# Patient Record
Sex: Male | Born: 1955 | Race: White | Hispanic: No | Smoking: Never smoker
Health system: Southern US, Community
[De-identification: ages and names within clinical notes are randomized; demographics above are authoritative.]

## PROBLEM LIST (undated history)

## (undated) ENCOUNTER — Emergency Department: Discharge status: Absent without leave | Payer: Medicare Other

## (undated) DIAGNOSIS — M1990 Unspecified osteoarthritis, unspecified site: Secondary | ICD-10-CM

## (undated) DIAGNOSIS — E119 Type 2 diabetes mellitus without complications: Secondary | ICD-10-CM

## (undated) DIAGNOSIS — G4730 Sleep apnea, unspecified: Secondary | ICD-10-CM

## (undated) DIAGNOSIS — M109 Gout, unspecified: Secondary | ICD-10-CM

## (undated) HISTORY — PX: ROTATOR CUFF REPAIR: SHX139

## (undated) HISTORY — PX: KNEE ARTHROSCOPY: SUR90

---

## 2012-04-23 ENCOUNTER — Encounter (HOSPITAL_COMMUNITY): Payer: Self-pay | Admitting: *Deleted

## 2012-04-23 DIAGNOSIS — E119 Type 2 diabetes mellitus without complications: Secondary | ICD-10-CM | POA: Insufficient documentation

## 2012-04-23 DIAGNOSIS — L02219 Cutaneous abscess of trunk, unspecified: Secondary | ICD-10-CM | POA: Insufficient documentation

## 2012-04-23 DIAGNOSIS — M109 Gout, unspecified: Secondary | ICD-10-CM | POA: Insufficient documentation

## 2012-04-23 DIAGNOSIS — Z7982 Long term (current) use of aspirin: Secondary | ICD-10-CM | POA: Insufficient documentation

## 2012-04-23 NOTE — ED Notes (Signed)
Per EMS - pt w/ left upper chest pain, pt w/ possible insect bite to left upper chest, wound area w/ erythema and hot to touch. Pt reports wound has been draining intermittently. Denies fever, body aches or n/v. EKG NSR and unremarkable on assessment.

## 2012-04-24 ENCOUNTER — Emergency Department (HOSPITAL_COMMUNITY)
Admission: EM | Admit: 2012-04-24 | Discharge: 2012-04-24 | Disposition: A | Discharge status: Home / Self Care | Payer: Self-pay | Attending: Emergency Medicine | Admitting: Emergency Medicine

## 2012-04-24 DIAGNOSIS — N61 Mastitis without abscess: Secondary | ICD-10-CM

## 2012-04-24 HISTORY — DX: Type 2 diabetes mellitus without complications: E11.9

## 2012-04-24 HISTORY — DX: Gout, unspecified: M10.9

## 2012-04-24 MED ORDER — CEPHALEXIN 500 MG PO CAPS: 500.0000 mg | ORAL_CAPSULE | Freq: Four times a day (QID) | ORAL | Status: DC

## 2012-04-24 MED ORDER — DOXYCYCLINE HYCLATE 100 MG PO CAPS: 100.0000 mg | ORAL_CAPSULE | Freq: Two times a day (BID) | ORAL | Status: DC

## 2012-04-24 NOTE — ED Provider Notes (Signed)
History     CSN: [id number]  Arrival date & time 04/23/12  2230   First MD Initiated Contact with Patient 04/24/12 0044      Chief Complaint  Patient presents with  . Insect Bite    (Consider location/radiation/quality/duration/timing/severity/associated sxs/prior treatment) HPI George Bridges is a 57 y.o. male presents with abscess and cellulitis.  HE has had this before requiring IND.  Locations is left upper chest, associated pain is moderate to severe, worse with contact or movement, denies associated fever, chills, abdominal pain, dyspnea, productive cough, other rash, myalgias.  Symptoms have been going on for 3-4 days, it has been draining pus intermittently.    Past Medical History  Diagnosis Date  . Diabetes mellitus without complication   . Gout     No past surgical history on file.  No family history on file.  History  Substance Use Topics  . Smoking status: Not on file  . Smokeless tobacco: Not on file  . Alcohol Use: Not on file      Review of Systems At least 10pt or greater review of systems completed and are negative except where specified in the HPI.  Allergies  Review of patient's allergies indicates no known allergies.  Home Medications   Current Outpatient Rx  Name  Route  Sig  Dispense  Refill  . aspirin EC 325 MG tablet   Oral   Take 325 mg by mouth daily.           BP 122/68  Pulse 86  Temp(Src) 98.9 F (37.2 C) (Oral)  Resp 16  Ht 5\' 7"  (1.702 m)  Wt 221 lb 4.8 oz (100.381 kg)  BMI 34.65 kg/m2  SpO2 98%  Physical Exam  Pulmonary/Chest:      Nursing notes reviewed.  Electronic medical record reviewed. VITAL SIGNS:   Filed Vitals:   04/23/12 2236  BP: 122/68  Pulse: 86  Temp: 98.9 F (37.2 C)  TempSrc: Oral  Resp: 16  Height: 5\' 7"  (1.702 m)  Weight: 221 lb 4.8 oz (100.381 kg)  SpO2: 98%   CONSTITUTIONAL: Awake, oriented x4, appears non-toxic HENT: Atraumatic, normocephalic, oral mucosa pink and moist, airway  patent. Nares patent without drainage. External ears normal. EYES: Conjunctiva clear, EOMI, PERRLA NECK: Trachea midline, non-tender, supple CARDIOVASCULAR: Normal heart rate, Normal rhythm, No murmurs, rubs, gallops PULMONARY/CHEST: Clear to auscultation, no rhonchi, wheezes, or rales. Symmetrical breath sounds. Central 1.5cm fluctuant ara that has been draining with 4-5cm of surrounding cellulitis. ABDOMINAL: Non-distended, soft, obese, non-tender - no rebound or guarding.  BS normal. NEUROLOGIC: Non-focal, moving all four extremities, no gross sensory or motor deficits. EXTREMITIES: No clubbing, cyanosis, or edema SKIN: Warm, Dry, No erythema, No rash.  Cellulitis on chest as desc.  ED Course  INCISION AND DRAINAGE Performed by: Jones Skene Authorized by: Jones Skene Consent: Verbal consent obtained. Body area: trunk Location details: left breast Anesthesia: local infiltration Local anesthetic: lidocaine 2% with epinephrine Anesthetic total: 10 ml Patient sedated: no Scalpel size: 11 Incision type: single straight Complexity: simple Drainage: purulent Drainage amount: moderate Wound treatment: wound left open Patient tolerance: Patient tolerated the procedure well with no immediate complications.   (including critical care time)  Labs Reviewed  GLUCOSE, CAPILLARY - Abnormal; Notable for the following:    Glucose-Capillary 334 (*)    All other components within normal limits   No results found.   1. Cellulitis and abscess of breast       MDM  George Bridges is  a 57 y.o. male presents with abscess and cellulitis to left breast fatty tissue.  Complete IND performed.  PT will be on antibiotics for cellulitis. Non-toxic, afebrile, no suggestion of systemic illness.  No indication for further work up.  I explained the diagnosis and have given explicit precautions to return to the ER including increasing redness, swelling, fever, chills or any other new or worsening  symptoms. The patient understands and accepts the medical plan as it's been dictated and I have answered their questions. Discharge instructions concerning home care and prescriptions have been given.  The patient is STABLE and is discharged to home in good condition.         Jones Skene, MD 04/27/12 1120

## 2012-04-24 NOTE — ED Notes (Signed)
Pt has a history of Diabetes Type II but has not been compliant with his medications for approx. A month and a half

## 2012-04-24 NOTE — ED Notes (Signed)
Ultrasound machine at bedside

## 2012-06-22 ENCOUNTER — Encounter (HOSPITAL_COMMUNITY): Payer: Self-pay

## 2012-06-22 ENCOUNTER — Emergency Department (HOSPITAL_COMMUNITY)
Admission: EM | Admit: 2012-06-22 | Discharge: 2012-06-23 | Disposition: A | Discharge status: Home / Self Care | Payer: Self-pay | Attending: Emergency Medicine | Admitting: Emergency Medicine

## 2012-06-22 DIAGNOSIS — E119 Type 2 diabetes mellitus without complications: Secondary | ICD-10-CM

## 2012-06-22 DIAGNOSIS — Z8669 Personal history of other diseases of the nervous system and sense organs: Secondary | ICD-10-CM | POA: Insufficient documentation

## 2012-06-22 DIAGNOSIS — Z8739 Personal history of other diseases of the musculoskeletal system and connective tissue: Secondary | ICD-10-CM | POA: Insufficient documentation

## 2012-06-22 DIAGNOSIS — R45851 Suicidal ideations: Secondary | ICD-10-CM | POA: Insufficient documentation

## 2012-06-22 DIAGNOSIS — F329 Major depressive disorder, single episode, unspecified: Secondary | ICD-10-CM

## 2012-06-22 DIAGNOSIS — G479 Sleep disorder, unspecified: Secondary | ICD-10-CM | POA: Insufficient documentation

## 2012-06-22 DIAGNOSIS — Z7982 Long term (current) use of aspirin: Secondary | ICD-10-CM | POA: Insufficient documentation

## 2012-06-22 HISTORY — DX: Sleep apnea, unspecified: G47.30

## 2012-06-22 LAB — CBC WITH DIFFERENTIAL/PLATELET
Basophils Absolute: 0.1 10*3/uL (ref 0.0–0.1)
Basophils Relative: 1 % (ref 0–1)
Eosinophils Absolute: 0.3 10*3/uL (ref 0.0–0.7)
HCT: 44.7 % (ref 39.0–52.0)
Lymphs Abs: 1.9 10*3/uL (ref 0.7–4.0)
MCHC: 35.3 g/dL (ref 30.0–36.0)
MCV: 87.6 fL (ref 78.0–100.0)
Monocytes Relative: 7 % (ref 3–12)
Neutro Abs: 2.4 10*3/uL (ref 1.7–7.7)
RDW: 12.3 % (ref 11.5–15.5)

## 2012-06-22 LAB — RAPID URINE DRUG SCREEN, HOSP PERFORMED
Amphetamines: NOT DETECTED
Barbiturates: NOT DETECTED
Benzodiazepines: NOT DETECTED
Cocaine: NOT DETECTED
Tetrahydrocannabinol: NOT DETECTED

## 2012-06-22 LAB — BASIC METABOLIC PANEL
BUN: 7 mg/dL (ref 6–23)
Chloride: 94 mEq/L — ABNORMAL LOW (ref 96–112)
Creatinine, Ser: 0.78 mg/dL (ref 0.50–1.35)
GFR calc Af Amer: >90 mL/min (ref >90)
Glucose, Bld: 499 mg/dL — ABNORMAL HIGH (ref 70–99)

## 2012-06-22 LAB — URINALYSIS, ROUTINE W REFLEX MICROSCOPIC
Bilirubin Urine: NEGATIVE
Glucose, UA: >1000 mg/dL — AB
Hgb urine dipstick: NEGATIVE
Ketones, ur: NEGATIVE mg/dL
Nitrite: NEGATIVE
pH: 6 (ref 5.0–8.0)

## 2012-06-22 LAB — ETHANOL: Alcohol, Ethyl (B): <11 mg/dL (ref 0–11)

## 2012-06-22 MED ORDER — SODIUM CHLORIDE 0.9 % IV SOLN
1000.0000 mL | INTRAVENOUS | Status: DC
  Administered 2012-06-22: 1000 mL via INTRAVENOUS

## 2012-06-22 MED ORDER — INSULIN ASPART 100 UNIT/ML ~~LOC~~ SOLN
10.0000 [IU] | Freq: Once | SUBCUTANEOUS | Status: AC
  Administered 2012-06-22: 10 [IU] via SUBCUTANEOUS
  Filled 2012-06-22: qty 1

## 2012-06-22 MED ORDER — SODIUM CHLORIDE 0.9 % IV SOLN
1000.0000 mL | Freq: Once | INTRAVENOUS | Status: AC
  Administered 2012-06-22: 1000 mL via INTRAVENOUS

## 2012-06-22 NOTE — ED Provider Notes (Signed)
History    This chart was scribed for Roxy Horseman PA-C, a non-physician practitioner working with Celene Kras, MD by Lewanda Rife, ED Scribe. This patient was seen in room WTR3/WLPT3 and the patient's care was started at 1547.     CSN: [id number]  Arrival date & time 06/22/12  1500   First MD Initiated Contact with Patient 06/22/12 1524      Chief Complaint  Patient presents with  . Medical Clearance    (Consider location/radiation/quality/duration/timing/severity/associated sxs/prior treatment) The history is provided by the patient.   HPI Comments: George Bridges is a 57 y.o. male who presents to the Emergency Department complaining of depression onset several weeks. Reports precipitant was his wife leaving him and not being able to find a job. Reports brother and sister have been sources of "psychological trauma". Reports it upsets him "Obama is in the white house". Reports suicidal ideations, but wants "someone else to do it". Brother sent him here with police and IVC papers. Denies HI. Denies visual and auditory hallucinations. Denies any physical pain, chest pain, fevers, and headaches. Denies alcohol, and drug use. Denies any alleviating or aggravating factors.     Past Medical History  Diagnosis Date  . Diabetes mellitus without complication   . Gout   . Sleep apnea     History reviewed. No pertinent past surgical history.  No family history on file.  History  Substance Use Topics  . Smoking status: Never Smoker   . Smokeless tobacco: Not on file  . Alcohol Use: No      Review of Systems A complete 10 system review of systems was obtained and all systems are negative except as noted in the HPI and PMH.    Allergies  Review of patient's allergies indicates no known allergies.  Home Medications   Current Outpatient Rx  Name  Route  Sig  Dispense  Refill  . aspirin EC 325 MG tablet   Oral   Take 325 mg by mouth daily.           BP 139/74   Pulse 87  Temp(Src) 98 F (36.7 C) (Oral)  Resp 12  SpO2 96%  Physical Exam  Nursing note and vitals reviewed. Constitutional: He is oriented to person, place, and time. He appears well-developed and well-nourished. No distress.  HENT:  Head: Normocephalic and atraumatic.  Eyes: EOM are normal.  Neck: Neck supple. No tracheal deviation present.  Cardiovascular: Normal rate, regular rhythm and normal heart sounds.  Exam reveals no gallop and no friction rub.   No murmur heard. Pulmonary/Chest: Effort normal and breath sounds normal. No respiratory distress. He has no wheezes.  Musculoskeletal: Normal range of motion.  Neurological: He is alert and oriented to person, place, and time.  Skin: Skin is warm and dry.  Psychiatric: His speech is normal. He is withdrawn. He exhibits a depressed mood. He expresses suicidal ideation. He expresses no homicidal ideation.    ED Course  Procedures (including critical care time) Medications  0.9 %  sodium chloride infusion (1,000 mLs Intravenous New Bag/Given 06/22/12 1721)    Followed by  0.9 %  sodium chloride infusion (not administered)    Followed by  0.9 %  sodium chloride infusion (not administered)   5:23 PM Discussed pt with Dr. Hyacinth Meeker and treatment plan is to infuse IV fluids. No signs of DKA and anion gap is 11. Pt is uncontrolled diabetic and has not been taking his medications since 2013. Suspect noncompliance for  hyperglycemia.   Labs Reviewed - No data to display No results found.   No diagnosis found.    MDM  Patients with depression and SI. Also with uncontrolled diabetes. Patient brought to the emergency department by family member for evaluation of depression and SI. Patient moved to the acute side, as he needs CPAP, and will also need CBG monitoring. Discussed patient with Dr. Hyacinth Meeker, who recommends 10 units of subcutaneous insulin. Discussed patient with the act team, who will evaluate the patient.    I personally  performed the services described in this documentation, which was scribed in my presence. The recorded information has been reviewed and is accurate.     Roxy Horseman, PA-C 06/22/12 2355

## 2012-06-22 NOTE — ED Notes (Addendum)
Pt stated that all his problems started in 2009 when he lost his job and wife lost her job he stated that he would get only part time work His brother owns a business and likes to  belittles him he denies SI ,but he thinks about killing himself every day.He denies HI only to kill his brother sometime

## 2012-06-22 NOTE — ED Notes (Signed)
[id number]<VW> Expected date:<BR> Expected time:<BR> Means of arrival:<BR> Comments:<BR> Bridges, George iv from [employer] he uses c pap to sleep and they can not keep him there. Not taking his medications diabetic, brother took out papers on him and pt is mad and upset.

## 2012-06-22 NOTE — ED Notes (Signed)
Pt presents with [employer] from home. Pt says he was taking a nap at home and he heard a knock on his door. Pt says it was his brother whom he has not spoken to in weeks. Pt says he has been having some family issues with his ex-wife and other family members. Pt is under IVC papers from his brother.

## 2012-06-22 NOTE — ED Provider Notes (Signed)
Medical screening examination/treatment/procedure(s) were performed by non-physician practitioner and as supervising physician I was immediately available for consultation/collaboration.   Celene Kras, MD 06/22/12 [phone]

## 2012-06-22 NOTE — ED Notes (Signed)
Security called to wand patient 

## 2012-06-23 DIAGNOSIS — F329 Major depressive disorder, single episode, unspecified: Secondary | ICD-10-CM

## 2012-06-23 LAB — GLUCOSE, CAPILLARY
Glucose-Capillary: 233 mg/dL — ABNORMAL HIGH (ref 70–99)
Glucose-Capillary: 219 mg/dL — ABNORMAL HIGH (ref 70–99)

## 2012-06-23 MED ORDER — FLUOXETINE HCL 20 MG PO TABS: 20.0000 mg | ORAL_TABLET | Freq: Every day | ORAL | Status: DC

## 2012-06-23 MED ORDER — METFORMIN HCL 500 MG PO TABS: 500.0000 mg | ORAL_TABLET | Freq: Two times a day (BID) | ORAL | Status: DC

## 2012-06-23 NOTE — Discharge Instructions (Signed)
RESOURCE GUIDE  Chronic Pain Problems: Contact [facility]  [phone] Patients need to be referred by their primary care doctor.  Insufficient Money for Medicine: Contact United Way:  call "211."   No Primary Care Doctor: - Call [facility]  [phone] - can help you locate a primary care doctor that  accepts your insurance, provides certain services, etc. - Physician Referral Service- 1-800-533-3463  Agencies that provide inexpensive medical care: - [facility]  [phone] - [facility]  [phone] - [facility]  [phone] - [facility]  [phone] - Planned Parenthood  [phone] - [facility]  [phone]  Medicaid-accepting [place] Providers: - [facility]- [address]  [phone], Mon-Fri 9am-7pm, Sat 9am-1pm - [facility]- [address], [address]  [phone] - [facility]- [address], [address]  [phone] - [facility]- [address] [place] [address]  [phone] - Veita Bland- [address], [phone]  Only accepts [place] Access Medicaid patients after they have their name  applied to their card  Self Pay (no insurance) in [place]: - Sickle Cell Patients: Dr Eric Dean, [facility]  [address], [phone] - [facility]- [address]  [phone]       -     [facility]- [address]       -     [facility]- see information above (Speak to Pam H if you do not have insurance)       -  [facility] [place]- [address],  [phone]       -  [facility]- [address] [place] [address], [phone]       -  Dr Osei-Bonsu-  [address], [place], [phone]       -  [facility] - [address], [phone]       -  [facility]- [address] [place] [address], [phone],  also [address], [phone]       -    [facility]- [address], [phone], 1st & 3rd Saturday        every month, 10am-1pm  [facility] [address] [place], [place] [address] [phone]  [facility] [address] [place],  [address] [phone]  1) Find a Doctor and Pay Out of Pocket Although you won't have to find out who is covered by your insurance plan, it is a good idea to ask around and get recommendations. You will then need to call the office and see if the doctor you have chosen will accept you as a new patient and what types of options they offer for patients who are self-pay. Some doctors offer discounts or will set up payment plans for their patients who do not have insurance, but you will need to ask so you aren't surprised when you get to your appointment.  2) Contact Your Local Health Department Not all health departments have doctors that can see patients for sick visits, but many do, so it is worth a call to see if yours does. If you don't know where your local health department is, you can check in your phone book. The CDC also has a tool   to help you locate your state's health department, and many state websites also have listings of all of their local health departments.  3) Find a Walk-in Clinic If your illness is not likely to be very severe or complicated, you may want to try a walk in clinic. These are popping up all over the country in pharmacies, drugstores, and shopping centers. They're usually staffed by nurse practitioners or physician assistants that have been trained to treat common illnesses and complaints. They're usually fairly quick and inexpensive. However, if you have serious medical issues or chronic medical problems, these are probably not your best option  STD Testing - [place] [facility] [place], [facility] Clinic, [address], [place],  phone [phone] or 1-877-539-9860.  Monday - Friday, call for an appointment. - [place] [facility] [place], [facility] Clinic, [address], [place], phone [phone] or 1-877-539-9860.  Monday - Friday, call for an appointment.  Abuse/Neglect: - [place] Child Abuse Hotline (336) 641-3795 - [place] Child Abuse Hotline 800-378-5315 (After Hours)  Emergency Shelter:  [facility] (336) 271-5985  Maternity Homes: - [facility] (336) 275-9566 - [facility] (704) 372-4663  MRSA Hotline #:   832-7006  Dental Assistance If unable to pay or uninsured, contact:  [place] [facility] to become qualified for the adult dental clinic.  Patients with Medicaid: [facility] [address], [phone], [phone]  If unable to pay, or uninsured, contact [place] [facility] ([phone] in [place], [phone] in [place]) to become qualified for the adult dental clinic  [facility] [address] [place], [place] [address] (336) [phone] www.drcivils.com  Other Low-Cost Community Dental Services: - [facility]- [address], [place], [place], [address], [phone], Ext. 123, 2nd and 4th Thursday of the month at 6:30am.  10 clients each day by appointment, can sometimes see walk-in patients if someone does not show for an appointment. - [facility]- [address], [place], [place], [address], [phone] - [facility]- [address], [place], [place], [address], [phone] - [place] [facility]- [phone] - [facility]- [phone] - [facility]- [phone]       Behavioral Health Resources in the Community  Intensive Outpatient Programs: [place] [facility]      [address] [place], [place] [phone] Both a day and  evening program       [facility]     [address]        [place], [place] [address] [phone]         ADS: [facility] [address] [place] 336-882-2125  [place] [facility] ACCESS LINE: 1-800-853-5163 or 336-641-4981 [address] [place],  [address] Http://www.[facility].[email or web address]/adult.htm   Substance Abuse Resources: - [facility]  336-882-2125 - [facility] 336-784-9470 - [facility] 336-285-9073 - [facility] 336-845-3988 - Residential & Outpatient Substance Abuse Program  800-659-3381  Psychological Services: -  Health  [phone] - [facility]  [phone] - [place] [facility], [address], , ACCESS LINE: 1-800-853-5163 or 336-641-4981, Http://www.[facility].com/services/adult.htm  Mobile Crisis Teams:                                          [facility]         Mobile Crisis Care Unit 1-877-626-1772             [facility] [address] [place] 336-834-9664                                         Interventionist Sharon DeEsch [address] [place] [phone]  Self-Help/Support Groups: [facility] [place] Variety of support groups [phone] (call for more info)   Narcotics Anonymous (NA) [facility] [address] [place] - 2 meetings at this location  Residential Treatment Programs:  [facility]      [address]        [place]       866-801-8205         [facility] [address] [place], [place]  [address] [phone]  [facility]  [address] [place], [place] [address] 336-845-3988 Admissions: 8am-3pm M-F  [facility]     [address]        [place], [place]  [address]       336-841-1104         [facility] [address] [place], [place] [phone]  [facility] [address] [place], [place] 336-285-9073  [facility] - Intensive Outpatient      [address]     [place], [place]       [phone]         [facility]     [address] [place], [place] 877-615-2722 or 336-784-9470  [facility] ([facility]), Medicaid [address] [place], [place] 336-227-7417  [facility]                                               [address] [place] 800-659-3381  [place] [facility] Resources: [facility]- 1-888-581-9988               General Therapy                                                Julie Brannon, PhD        [address]                                       [place], [place] [address]         [phone]   Insurance  [facility]   [address] [place],  [address] 336-349-4454  [facility] [address] [place],  [address] 336-342-8316 Insurance/Medicaid/sponsorship through [facility]  [facility]                                              [address]                                          [place], [place] [address]    Therapy/tele-psych/case         336-342-8316          [facility] [address]   [place], [place]  [address]  Adolescent/group home/case management 336-349-2233                                           Julia Brannon PhD       General therapy       Insurance   [phone]         Dr. Arfeen, Insurance, M-F 336- [phone]  [facility] [place]  [employer] [place] [facility] [address]                 [address]         [address]  [place]                                               [place]                              [place] Phone:  [phone]                                  Phone:   [phone]                   Phone:  [phone]  [place] [facility], [phone] - [place] Services - [facility]- 1-888-581-9988       -     [facility] in , [address],             336-349-4454, Insurance  [place] Child Abuse Hotline (336) 342-1394 or (336) 342-3537 (After Hours)  

## 2012-06-23 NOTE — Consult Note (Signed)
Reason for Consult: Depression and suicidal ideation Referring Physician: Dr. Damian Leavell [name] is an 57 y.o. male.  HPI: George Bridges is a 57 y.o. White male who presents to the Emergency Department with IVC from his brother for  depression over several weeks and passive suicidal thoughts. He was separated from his his wife since 2009 and divorce finalized April 2014. He has lost his full time job and staying with his father over several years. Reportedly his brother and sister have been sources of "psychological trauma and embarrassing him on face book with their comments". Patient endorsing symptoms depression, unhappy, hopeless, helpless, disturbance of sleep and appetite. He has not able to care for diabetes medication. Patient denied suicidal ideation, intension and plans. Denies HI. Denies visual and auditory hallucinations.   Mental Status Examination: Patient appeared as per his stated age, well-developed, and in well-nourished, overweight  in hospital scrubs and poorly  groomed, and maintaining  fair  eye contact. Patient has depressed mood and his affect was constricted. He has normal speech. His thought process is linear and goal directed. Patient has denied suicidal, homicidal ideations, intentions or plans. Patient has no evidence of auditory or visual hallucinations, delusions, and paranoia. Patient has fair insight judgment and impulse control.  Past Medical History  Diagnosis Date  . Diabetes mellitus without complication   . Gout   . Sleep apnea     History reviewed. No pertinent past surgical history.  No family history on file.  Social History:  reports that he has never smoked. He does not have any smokeless tobacco history on file. He reports that he does not drink alcohol or use illicit drugs.  Allergies: No Known Allergies  Medications: I have reviewed the patient's current medications.  Results for orders placed during the hospital encounter of 06/22/12 (from the  past 48 hour(s))  URINALYSIS, ROUTINE W REFLEX MICROSCOPIC     Status: Abnormal   Collection Time    06/22/12  3:44 PM      Result Value Range   Color, Urine YELLOW  YELLOW   APPearance CLEAR  CLEAR   Specific Gravity, Urine 1.040 (*) 1.005 - 1.030   pH 6.0  5.0 - 8.0   Glucose, UA >1000 (*) NEGATIVE mg/dL   Hgb urine dipstick NEGATIVE  NEGATIVE   Bilirubin Urine NEGATIVE  NEGATIVE   Ketones, ur NEGATIVE  NEGATIVE mg/dL   Protein, ur NEGATIVE  NEGATIVE mg/dL   Urobilinogen, UA 1.0  0.0 - 1.0 mg/dL   Nitrite NEGATIVE  NEGATIVE   Leukocytes, UA NEGATIVE  NEGATIVE  URINE RAPID DRUG SCREEN (HOSP PERFORMED)     Status: None   Collection Time    06/22/12  3:44 PM      Result Value Range   Opiates NONE DETECTED  NONE DETECTED   Cocaine NONE DETECTED  NONE DETECTED   Benzodiazepines NONE DETECTED  NONE DETECTED   Amphetamines NONE DETECTED  NONE DETECTED   Tetrahydrocannabinol NONE DETECTED  NONE DETECTED   Barbiturates NONE DETECTED  NONE DETECTED   Comment:            DRUG SCREEN FOR MEDICAL PURPOSES     ONLY.  IF CONFIRMATION IS NEEDED     FOR ANY PURPOSE, NOTIFY LAB     WITHIN 5 DAYS.                LOWEST DETECTABLE LIMITS     FOR URINE DRUG SCREEN     Drug Class  Cutoff (ng/mL)     Amphetamine      1000     Barbiturate      200     Benzodiazepine   200     Tricyclics       300     Opiates          300     Cocaine          300     THC              50  URINE MICROSCOPIC-ADD ON     Status: Abnormal   Collection Time    06/22/12  3:44 PM      Result Value Range   Bacteria, UA FEW (*) RARE  CBC WITH DIFFERENTIAL     Status: Abnormal   Collection Time    06/22/12  3:54 PM      Result Value Range   WBC 5.1  4.0 - 10.5 K/uL   RBC 5.10  4.22 - 5.81 MIL/uL   Hemoglobin 15.8  13.0 - 17.0 g/dL   HCT 40.9  81.1 - 91.4 %   MCV 87.6  78.0 - 100.0 fL   MCH 31.0  26.0 - 34.0 pg   MCHC 35.3  30.0 - 36.0 g/dL   RDW 78.2  95.6 - 21.3 %   Platelets 84 (*) 150 - 400 K/uL    Comment: SPECIMEN CHECKED FOR CLOTS     REPEATED TO VERIFY   Neutrophils Relative 48  43 - 77 %   Lymphocytes Relative 38  12 - 46 %   Monocytes Relative 7  3 - 12 %   Eosinophils Relative 6 (*) 0 - 5 %   Basophils Relative 1  0 - 1 %   Neutro Abs 2.4  1.7 - 7.7 K/uL   Lymphs Abs 1.9  0.7 - 4.0 K/uL   Monocytes Absolute 0.4  0.1 - 1.0 K/uL   Eosinophils Absolute 0.3  0.0 - 0.7 K/uL   Basophils Absolute 0.1  0.0 - 0.1 K/uL   Smear Review LARGE PLATELETS PRESENT    BASIC METABOLIC PANEL     Status: Abnormal   Collection Time    06/22/12  3:54 PM      Result Value Range   Sodium 131 (*) 135 - 145 mEq/L   Potassium 4.3  3.5 - 5.1 mEq/L   Chloride 94 (*) 96 - 112 mEq/L   CO2 26  19 - 32 mEq/L   Glucose, Bld 499 (*) 70 - 99 mg/dL   BUN 7  6 - 23 mg/dL   Creatinine, Ser 0.86  0.50 - 1.35 mg/dL   Calcium 9.6  8.4 - 57.8 mg/dL   GFR calc non Af Amer >90  >90 mL/min   GFR calc Af Amer >90  >90 mL/min   Comment:            The eGFR has been calculated     using the CKD EPI equation.     This calculation has not been     validated in all clinical     situations.     eGFR's persistently     <90 mL/min signify     possible Chronic Kidney Disease.  ETHANOL     Status: None   Collection Time    06/22/12  3:54 PM      Result Value Range   Alcohol, Ethyl (B) <11  0 - 11 mg/dL   Comment:  LOWEST DETECTABLE LIMIT FOR     SERUM ALCOHOL IS 11 mg/dL     FOR MEDICAL PURPOSES ONLY  GLUCOSE, CAPILLARY     Status: Abnormal   Collection Time    06/22/12  6:54 PM      Result Value Range   Glucose-Capillary 408 (*) 70 - 99 mg/dL  GLUCOSE, CAPILLARY     Status: Abnormal   Collection Time    06/22/12  9:46 PM      Result Value Range   Glucose-Capillary 389 (*) 70 - 99 mg/dL  GLUCOSE, CAPILLARY     Status: Abnormal   Collection Time    06/22/12 11:38 PM      Result Value Range   Glucose-Capillary 291 (*) 70 - 99 mg/dL  GLUCOSE, CAPILLARY     Status: Abnormal   Collection Time     06/23/12  1:58 AM      Result Value Range   Glucose-Capillary 233 (*) 70 - 99 mg/dL  GLUCOSE, CAPILLARY     Status: Abnormal   Collection Time    06/23/12  4:12 AM      Result Value Range   Glucose-Capillary 219 (*) 70 - 99 mg/dL    No results found.  Positive for anxiety, bad mood, behavior problems, depression, separation anxiety and sleep disturbance Blood pressure 107/67, pulse 68, temperature 97.7 F (36.5 C), temperature source Oral, resp. rate 18, SpO2 100.00%.   Assessment/Plan: Depression NOS  Recommendation: Recommended start fluoxetine 20 mg daily and referred to out patient psychiatric services at [facility]. Rescind IVC as he has been contracting for safety. Patient does not meet criteria for in patient psychiatric hospitalization.  Cindy Brindisi,JANARDHAHA R. 06/23/2012, 10:01 AM

## 2012-06-23 NOTE — [facility] Suicide Risk Assessment (Signed)
Suicide Risk Assessment  Discharge Assessment     Demographic Factors:  Male, Adolescent or young adult, Divorced or widowed, Caucasian, Low socioeconomic status and Unemployed  Mental Status Per Nursing Assessment::   On Admission:     Current Mental Status by Physician: NA  Loss Factors: Financial problems/change in socioeconomic status  Historical Factors: NA  Risk Reduction Factors:   Sense of responsibility to family, Religious beliefs about death, Living with another person, especially a relative, Positive social support and Positive therapeutic relationship  Continued Clinical Symptoms:  Depression:   Anhedonia Hopelessness Impulsivity Insomnia Recent sense of peace/wellbeing Severe  Cognitive Features That Contribute To Risk:  Loss of executive function Polarized thinking    Suicide Risk:  Minimal: No identifiable suicidal ideation.  Patients presenting with no risk factors but with morbid ruminations; may be classified as minimal risk based on the severity of the depressive symptoms  Discharge Diagnoses:   AXIS I:  Major Depression, single episode AXIS II:  Deferred AXIS III:   Past Medical History  Diagnosis Date  . Diabetes mellitus without complication   . Gout   . Sleep apnea    AXIS IV:  economic problems, housing problems, occupational problems, other psychosocial or environmental problems, problems related to social environment and problems with access to health care services AXIS V:  41-50 serious symptoms  Plan Of Care/Follow-up recommendations:  Activity:  as tolerated Diet:  regular  Is patient on multiple antipsychotic therapies at discharge:  No   Has Patient had three or more failed trials of antipsychotic monotherapy by history:  No  Recommended Plan for Multiple Antipsychotic Therapies: Not applicable  Savian Mazon,JANARDHAHA R. 06/23/2012, 11:06 AM

## 2012-06-23 NOTE — Progress Notes (Signed)
Late entry for 06/22/12 ED visit: [place] resident. Partnership for [facility], Kenny[facility], saw pt Pt refused services

## 2012-06-23 NOTE — ED Provider Notes (Signed)
Filed Vitals:   06/23/12 0822  BP: 107/67  Pulse: 68  Temp: 97.7 F (36.5 C)  Resp: 18    Glucose is down to 219 here.  Pt is non complaint, will write for metformin.  Dr. Shela Commons with psychiatry has seen pt, pt is chronically SI, has MDD.  He doesn't think pt needs inpatient.  He is ok with IVC being rescinded.  Will refer to local PCP's, and per Dr. Shela Commons, give Rx for prozac and he can follow up with [facility] as outpt.    Impressino: MDD Type 2 DM   Gavin Pound. Ellenora Talton, MD 06/23/12 [phone]

## 2012-06-23 NOTE — ED Notes (Signed)
Patient given a drink and another warm blanket. Nurse aware.

## 2017-08-07 ENCOUNTER — Emergency Department (HOSPITAL_COMMUNITY): Payer: Medicare Other

## 2017-08-07 ENCOUNTER — Inpatient Hospital Stay (HOSPITAL_COMMUNITY)
Admission: EM | Admit: 2017-08-07 | Discharge: 2017-08-13 | DRG: 377 | Disposition: A | Discharge status: Home / Self Care | Payer: Medicare Other | Attending: Internal Medicine | Admitting: Internal Medicine

## 2017-08-07 ENCOUNTER — Other Ambulatory Visit: Payer: Self-pay

## 2017-08-07 ENCOUNTER — Encounter (HOSPITAL_COMMUNITY): Payer: Self-pay

## 2017-08-07 DIAGNOSIS — Z8249 Family history of ischemic heart disease and other diseases of the circulatory system: Secondary | ICD-10-CM

## 2017-08-07 DIAGNOSIS — K269 Duodenal ulcer, unspecified as acute or chronic, without hemorrhage or perforation: Secondary | ICD-10-CM | POA: Diagnosis present

## 2017-08-07 DIAGNOSIS — Z885 Allergy status to narcotic agent status: Secondary | ICD-10-CM | POA: Diagnosis not present

## 2017-08-07 DIAGNOSIS — K7469 Other cirrhosis of liver: Secondary | ICD-10-CM | POA: Diagnosis present

## 2017-08-07 DIAGNOSIS — E669 Obesity, unspecified: Secondary | ICD-10-CM | POA: Diagnosis present

## 2017-08-07 DIAGNOSIS — Z79899 Other long term (current) drug therapy: Secondary | ICD-10-CM

## 2017-08-07 DIAGNOSIS — Z833 Family history of diabetes mellitus: Secondary | ICD-10-CM | POA: Diagnosis not present

## 2017-08-07 DIAGNOSIS — E11649 Type 2 diabetes mellitus with hypoglycemia without coma: Secondary | ICD-10-CM | POA: Diagnosis present

## 2017-08-07 DIAGNOSIS — E118 Type 2 diabetes mellitus with unspecified complications: Secondary | ICD-10-CM | POA: Diagnosis not present

## 2017-08-07 DIAGNOSIS — D62 Acute posthemorrhagic anemia: Secondary | ICD-10-CM | POA: Diagnosis present

## 2017-08-07 DIAGNOSIS — K259 Gastric ulcer, unspecified as acute or chronic, without hemorrhage or perforation: Secondary | ICD-10-CM | POA: Diagnosis present

## 2017-08-07 DIAGNOSIS — K921 Melena: Secondary | ICD-10-CM

## 2017-08-07 DIAGNOSIS — K2971 Gastritis, unspecified, with bleeding: Secondary | ICD-10-CM | POA: Diagnosis present

## 2017-08-07 DIAGNOSIS — R188 Other ascites: Secondary | ICD-10-CM | POA: Diagnosis present

## 2017-08-07 DIAGNOSIS — M109 Gout, unspecified: Secondary | ICD-10-CM | POA: Diagnosis present

## 2017-08-07 DIAGNOSIS — G4730 Sleep apnea, unspecified: Secondary | ICD-10-CM | POA: Diagnosis present

## 2017-08-07 DIAGNOSIS — I10 Essential (primary) hypertension: Secondary | ICD-10-CM | POA: Diagnosis present

## 2017-08-07 DIAGNOSIS — K7460 Unspecified cirrhosis of liver: Secondary | ICD-10-CM | POA: Diagnosis not present

## 2017-08-07 DIAGNOSIS — F329 Major depressive disorder, single episode, unspecified: Secondary | ICD-10-CM | POA: Diagnosis present

## 2017-08-07 DIAGNOSIS — E119 Type 2 diabetes mellitus without complications: Secondary | ICD-10-CM

## 2017-08-07 DIAGNOSIS — I8500 Esophageal varices without bleeding: Secondary | ICD-10-CM | POA: Diagnosis present

## 2017-08-07 DIAGNOSIS — E43 Unspecified severe protein-calorie malnutrition: Secondary | ICD-10-CM | POA: Diagnosis present

## 2017-08-07 DIAGNOSIS — Z6834 Body mass index (BMI) 34.0-34.9, adult: Secondary | ICD-10-CM | POA: Diagnosis not present

## 2017-08-07 DIAGNOSIS — Z7984 Long term (current) use of oral hypoglycemic drugs: Secondary | ICD-10-CM | POA: Diagnosis not present

## 2017-08-07 DIAGNOSIS — Z7982 Long term (current) use of aspirin: Secondary | ICD-10-CM | POA: Diagnosis not present

## 2017-08-07 DIAGNOSIS — D500 Iron deficiency anemia secondary to blood loss (chronic): Secondary | ICD-10-CM

## 2017-08-07 DIAGNOSIS — K7031 Alcoholic cirrhosis of liver with ascites: Secondary | ICD-10-CM

## 2017-08-07 DIAGNOSIS — R531 Weakness: Secondary | ICD-10-CM | POA: Diagnosis present

## 2017-08-07 DIAGNOSIS — K922 Gastrointestinal hemorrhage, unspecified: Secondary | ICD-10-CM

## 2017-08-07 LAB — COMPREHENSIVE METABOLIC PANEL
ALT: 45 U/L (ref 17–63)
ANION GAP: 12 (ref 5–15)
AST: 50 U/L — AB (ref 15–41)
Albumin: 2.8 g/dL — ABNORMAL LOW (ref 3.5–5.0)
Alkaline Phosphatase: 57 U/L (ref 38–126)
BUN: 52 mg/dL — AB (ref 6–20)
CHLORIDE: 96 mmol/L — AB (ref 101–111)
CO2: 23 mmol/L (ref 22–32)
Calcium: 8.2 mg/dL — ABNORMAL LOW (ref 8.9–10.3)
Creatinine, Ser: 1.2 mg/dL (ref 0.61–1.24)
GFR calc Af Amer: >60 mL/min (ref >60)
Glucose, Bld: 432 mg/dL — ABNORMAL HIGH (ref 65–99)
POTASSIUM: 3.5 mmol/L (ref 3.5–5.1)
Sodium: 131 mmol/L — ABNORMAL LOW (ref 135–145)
Total Bilirubin: 1.4 mg/dL — ABNORMAL HIGH (ref 0.3–1.2)
Total Protein: 6 g/dL — ABNORMAL LOW (ref 6.5–8.1)

## 2017-08-07 LAB — ACETAMINOPHEN LEVEL

## 2017-08-07 LAB — SALICYLATE LEVEL

## 2017-08-07 LAB — ETHANOL

## 2017-08-07 LAB — RAPID URINE DRUG SCREEN, HOSP PERFORMED
AMPHETAMINES: NOT DETECTED
Benzodiazepines: NOT DETECTED
Cocaine: NOT DETECTED
OPIATES: NOT DETECTED
Tetrahydrocannabinol: NOT DETECTED

## 2017-08-07 LAB — CBC WITH DIFFERENTIAL/PLATELET
BASOS ABS: 0 10*3/uL (ref 0.0–0.1)
BASOS PCT: 0 %
Eosinophils Absolute: 0.3 10*3/uL (ref 0.0–0.7)
Eosinophils Relative: 3 %
HCT: 25.5 % — ABNORMAL LOW (ref 39.0–52.0)
Hemoglobin: 8.7 g/dL — ABNORMAL LOW (ref 13.0–17.0)
LYMPHS PCT: 32 %
Lymphs Abs: 2.6 10*3/uL (ref 0.7–4.0)
MCH: 32.6 pg (ref 26.0–34.0)
MCHC: 34.1 g/dL (ref 30.0–36.0)
MCV: 95.5 fL (ref 78.0–100.0)
MONO ABS: 0.6 10*3/uL (ref 0.1–1.0)
Monocytes Relative: 7 %
Neutro Abs: 4.6 10*3/uL (ref 1.7–7.7)
Neutrophils Relative %: 58 %
PLATELETS: 97 10*3/uL — AB (ref 150–400)
RBC: 2.67 MIL/uL — ABNORMAL LOW (ref 4.22–5.81)
RDW: 13.4 % (ref 11.5–15.5)
WBC: 8 10*3/uL (ref 4.0–10.5)

## 2017-08-07 LAB — HEMOGLOBIN AND HEMATOCRIT, BLOOD
HEMATOCRIT: 26.1 % — AB (ref 39.0–52.0)
HEMOGLOBIN: 9.1 g/dL — AB (ref 13.0–17.0)

## 2017-08-07 LAB — HEMOGLOBIN A1C
Hgb A1c MFr Bld: 11.4 % — ABNORMAL HIGH (ref 4.8–5.6)
MEAN PLASMA GLUCOSE: 280.48 mg/dL

## 2017-08-07 LAB — POC OCCULT BLOOD, ED: FECAL OCCULT BLD: POSITIVE — AB

## 2017-08-07 LAB — GLUCOSE, CAPILLARY
GLUCOSE-CAPILLARY: 229 mg/dL — AB (ref 65–99)
Glucose-Capillary: 300 mg/dL — ABNORMAL HIGH (ref 65–99)
Glucose-Capillary: 243 mg/dL — ABNORMAL HIGH (ref 65–99)
Glucose-Capillary: 260 mg/dL — ABNORMAL HIGH (ref 65–99)

## 2017-08-07 LAB — ABO/RH: ABO/RH(D): A POS

## 2017-08-07 LAB — PROTIME-INR
INR: 1.41
Prothrombin Time: 17.1 seconds — ABNORMAL HIGH (ref 11.4–15.2)

## 2017-08-07 LAB — CBG MONITORING, ED
GLUCOSE-CAPILLARY: 417 mg/dL — AB (ref 65–99)
GLUCOSE-CAPILLARY: 364 mg/dL — AB (ref 65–99)

## 2017-08-07 LAB — MRSA PCR SCREENING: MRSA by PCR: NEGATIVE

## 2017-08-07 LAB — PREPARE RBC (CROSSMATCH)

## 2017-08-07 MED ORDER — SODIUM CHLORIDE 0.9 % IV SOLN
INTRAVENOUS | Status: DC
  Administered 2017-08-07 – 2017-08-09 (×5): via INTRAVENOUS
  Administered 2017-08-10: 75 mL/h via INTRAVENOUS
  Administered 2017-08-10 (×2): via INTRAVENOUS

## 2017-08-07 MED ORDER — SODIUM CHLORIDE 0.9 % IV SOLN
50.0000 ug/h | INTRAVENOUS | Status: DC
  Administered 2017-08-07 – 2017-08-11 (×10): 50 ug/h via INTRAVENOUS
  Filled 2017-08-07 (×15): qty 1

## 2017-08-07 MED ORDER — IOPAMIDOL (ISOVUE-300) INJECTION 61%
100.0000 mL | Freq: Once | INTRAVENOUS | Status: AC | PRN
  Administered 2017-08-07: 100 mL via INTRAVENOUS

## 2017-08-07 MED ORDER — PANTOPRAZOLE SODIUM 40 MG IV SOLR: 40.0000 mg | Freq: Two times a day (BID) | INTRAVENOUS | Status: DC

## 2017-08-07 MED ORDER — SODIUM CHLORIDE 0.9 % IV SOLN
80.0000 mg | Freq: Once | INTRAVENOUS | Status: AC
  Administered 2017-08-07: 80 mg via INTRAVENOUS
  Filled 2017-08-07: qty 80

## 2017-08-07 MED ORDER — ONDANSETRON HCL 4 MG/2ML IJ SOLN
4.0000 mg | Freq: Four times a day (QID) | INTRAMUSCULAR | Status: DC | PRN
  Administered 2017-08-07: 4 mg via INTRAVENOUS
  Filled 2017-08-07: qty 2

## 2017-08-07 MED ORDER — ONDANSETRON HCL 4 MG PO TABS: 4.0000 mg | ORAL_TABLET | Freq: Four times a day (QID) | ORAL | Status: DC | PRN

## 2017-08-07 MED ORDER — INSULIN ASPART 100 UNIT/ML ~~LOC~~ SOLN
0.0000 [IU] | SUBCUTANEOUS | Status: DC
  Administered 2017-08-07 (×2): 3 [IU] via SUBCUTANEOUS
  Administered 2017-08-07 – 2017-08-08 (×3): 5 [IU] via SUBCUTANEOUS
  Administered 2017-08-08 – 2017-08-09 (×8): 3 [IU] via SUBCUTANEOUS
  Administered 2017-08-09: 2 [IU] via SUBCUTANEOUS

## 2017-08-07 MED ORDER — IOPAMIDOL (ISOVUE-300) INJECTION 61%
INTRAVENOUS | Status: AC
  Filled 2017-08-07: qty 100

## 2017-08-07 MED ORDER — SODIUM CHLORIDE 0.9 % IV BOLUS
1000.0000 mL | Freq: Once | INTRAVENOUS | Status: AC
  Administered 2017-08-07: 1000 mL via INTRAVENOUS

## 2017-08-07 MED ORDER — SODIUM CHLORIDE 0.9 % IV SOLN
8.0000 mg/h | INTRAVENOUS | Status: DC
  Administered 2017-08-07 – 2017-08-08 (×3): 8 mg/h via INTRAVENOUS
  Filled 2017-08-07 (×5): qty 80

## 2017-08-07 NOTE — ED Notes (Signed)
Bed: WA21 Expected date:  Expected time:  Means of arrival:  Comments: [age] M hypoglycemia and emotional distress

## 2017-08-07 NOTE — ED Notes (Signed)
EKG given to EDP,Cardama,MD., for review. 

## 2017-08-07 NOTE — Plan of Care (Signed)
Received signout from Dr. Eudelia Bunchardama. Mr. George Bridges is a 62 year old male with past medical history significant for uncontrolled DM type II; who EMS were initially called on due to emotional distress after patient fiance left him.  Initial glucose noted to be 11 for which patient was given 25 g of D10 and 15 g of Insta-Glucose.  Repeat blood sugar 491.  Patient also noted to complain of dark stools.  On admission blood pressures were noted to be soft 94/51-101/50.   Stool guaiac +(melena).  Hemoglobin 8.7(previously 13.3 in 2018), platelets 97, BUN 52, creatinine 1.2, and glucose 432.  Acetaminophen, salicylate, ethanol level undetectable.  CT scan abdomen pelvis showed signs of hepatic cirrhosis with diffuse ascites.  Patient was typed and screened and given 1 L of normal saline IV fluids.  TRH called to admit.  Suspect upper GI bleed.  Place patient on Protonix drip, ordered patient be prepared 2 units of PRBCs and transfused 1 unit of PRBCs. Will need to consult GI in a.m. question need of octreotide drip as well.  Accepted patient to a stepdown bed.

## 2017-08-07 NOTE — ED Notes (Signed)
ED TO INPATIENT HANDOFF REPORT  Name/Age/Gender George Bridges 62 y.o. male  Code Status Code Status History    Date Active Date Inactive Code Status Order ID Comments User Context   06/22/2012 1745 06/23/2012 1506 Full Code [id number]  [facility]      Home/SNF/Other Home  Chief Complaint ?  Level of Care/Admitting Diagnosis ED Disposition    ED Disposition Condition [facility] Area: [place] [100102]  Level of Care: Stepdown [14]  Admit to SDU based on following criteria: Hemodynamic compromise or significant risk of instability:  Patient requiring short term acute titration and management of vasoactive drips, and invasive monitoring (i.e., CVP and Arterial line).  Diagnosis: GI bleed [732202]  Admitting Physician: ANTONE, SUMMONS [[id number]]  Attending Physician: LENIX, KIDD [[id number]]  Estimated length of stay: past midnight tomorrow  Certification:: I certify this patient will need inpatient services for at least 2 midnights  PT Class (Do Not Modify): Inpatient [101]  PT Acc Code (Do Not Modify): Private [1]       Medical History Past Medical History:  Diagnosis Date  . Diabetes mellitus without complication ([place])   . Gout   . Sleep apnea     Allergies Allergies  Allergen Reactions  . Oxycodone Hives    IV Location/Drains/Wounds Patient Lines/Drains/Airways Status   Active Line/Drains/Airways    Name:   Placement date:   Placement time:   Site:   Days:   Peripheral IV 08/07/17 Left Antecubital   08/07/17    0300    Antecubital   less than 1   Peripheral IV 08/07/17 Left;Posterior Forearm   08/07/17    0736    Forearm   less than 1          Labs/Imaging Results for orders placed or performed during the hospital encounter of 08/07/17 (from the past 48 hour(s))  Urine rapid drug screen (hosp performed)     Status: Abnormal   Collection Time: 08/07/17  3:26 AM  Result Value Ref Range   Opiates NONE  DETECTED NONE DETECTED   Cocaine NONE DETECTED NONE DETECTED   Benzodiazepines NONE DETECTED NONE DETECTED   Amphetamines NONE DETECTED NONE DETECTED   Tetrahydrocannabinol NONE DETECTED NONE DETECTED   Barbiturates (A) NONE DETECTED    Result not available. Reagent lot number recalled by manufacturer.    Comment: Performed at [facility], [place] [address], [place], [place] [address]  POC CBG, ED     Status: Abnormal   Collection Time: 08/07/17  3:33 AM  Result Value Ref Range   Glucose-Capillary 417 (H) 65 - 99 mg/dL  CBC with Differential/Platelet     Status: Abnormal   Collection Time: 08/07/17  4:03 AM  Result Value Ref Range   WBC 8.0 4.0 - 10.5 K/uL   RBC 2.67 (L) 4.22 - 5.81 MIL/uL   Hemoglobin 8.7 (L) 13.0 - 17.0 g/dL   HCT 25.5 (L) 39.0 - 52.0 %   MCV 95.5 78.0 - 100.0 fL   MCH 32.6 26.0 - 34.0 pg   MCHC 34.1 30.0 - 36.0 g/dL   RDW 13.4 11.5 - 15.5 %   Platelets 97 (L) 150 - 400 K/uL    Comment: SPECIMEN CHECKED FOR CLOTS REPEATED TO VERIFY PLATELET COUNT CONFIRMED BY SMEAR    Neutrophils Relative % 58 %   Neutro Abs 4.6 1.7 - 7.7 K/uL   Lymphocytes Relative 32 %   Lymphs Abs 2.6  0.7 - 4.0 K/uL   Monocytes Relative 7 %   Monocytes Absolute 0.6 0.1 - 1.0 K/uL   Eosinophils Relative 3 %   Eosinophils Absolute 0.3 0.0 - 0.7 K/uL   Basophils Relative 0 %   Basophils Absolute 0.0 0.0 - 0.1 K/uL    Comment: Performed at [facility], [place] [address], [place], [place] [address]  Comprehensive metabolic panel     Status: Abnormal   Collection Time: 08/07/17  4:03 AM  Result Value Ref Range   Sodium 131 (L) 135 - 145 mmol/L   Potassium 3.5 3.5 - 5.1 mmol/L   Chloride 96 (L) 101 - 111 mmol/L   CO2 23 22 - 32 mmol/L   Glucose, Bld 432 (H) 65 - 99 mg/dL   BUN 52 (H) 6 - 20 mg/dL   Creatinine, Ser 1.20 0.61 - 1.24 mg/dL   Calcium 8.2 (L) 8.9 - 10.3 mg/dL   Total Protein 6.0 (L) 6.5 - 8.1 g/dL   Albumin 2.8 (L) 3.5 - 5.0 g/dL   AST 50  (H) 15 - 41 U/L   ALT 45 17 - 63 U/L   Alkaline Phosphatase 57 38 - 126 U/L   Total Bilirubin 1.4 (H) 0.3 - 1.2 mg/dL   GFR calc non Af Amer >60 >60 mL/min   GFR calc Af Amer >60 >60 mL/min    Comment: (NOTE) The eGFR has been calculated using the CKD EPI equation. This calculation has not been validated in all clinical situations. eGFR's persistently <60 mL/min signify possible Chronic Kidney Disease.    Anion gap 12 5 - 15    Comment: Performed at [facility], [place] [address], [place], [place] [address]  Acetaminophen level     Status: Abnormal   Collection Time: 08/07/17  4:03 AM  Result Value Ref Range   Acetaminophen (Tylenol), Serum <10 (L) 10 - 30 ug/mL    Comment: (NOTE) Therapeutic concentrations vary significantly. A range of 10-30 ug/mL  may be an effective concentration for many patients. However, some  are best treated at concentrations outside of this range. Acetaminophen concentrations >150 ug/mL at 4 hours after ingestion  and >50 ug/mL at 12 hours after ingestion are often associated with  toxic reactions. Performed at [facility], [place] [address], [place], [place] [address]   Salicylate level     Status: None   Collection Time: 08/07/17  4:03 AM  Result Value Ref Range   Salicylate Lvl <1.5 2.8 - 30.0 mg/dL    Comment: Performed at [facility], [place] [address] [place], [place] [address]  Ethanol     Status: None   Collection Time: 08/07/17  4:03 AM  Result Value Ref Range   Alcohol, Ethyl (B) <10 <10 mg/dL    Comment: (NOTE) Lowest detectable limit for serum alcohol is 10 mg/dL. For medical purposes only. Performed at [facility], [place] [address], [place], [place] [address]   ABO/Rh     Status: None   Collection Time: 08/07/17  4:03 AM  Result Value Ref Range   ABO/RH(D)      A POS Performed at [facility], [place] [address] [place], [place] [address]    Type and screen Cassandra     Status: None (Preliminary result)   Collection Time: 08/07/17  4:05 AM  Result Value Ref Range   ABO/RH(D) A POS    Antibody Screen NEG  Sample Expiration      08/10/2017 Performed at [facility], [place] [place], [place] [address]    Unit Number [id number]    Blood Component Type RED CELLS,LR    Unit division 00    Status of Unit ALLOCATED    Transfusion Status OK TO TRANSFUSE    Crossmatch Result Compatible    Unit Number [id number]    Blood Component Type RED CELLS,LR    Unit division 00    Status of Unit ALLOCATED    Transfusion Status OK TO TRANSFUSE    Crossmatch Result Compatible   POC occult blood, ED Provider will collect     Status: Abnormal   Collection Time: 08/07/17  4:28 AM  Result Value Ref Range   Fecal Occult Bld POSITIVE (A) NEGATIVE  CBG monitoring, ED     Status: Abnormal   Collection Time: 08/07/17  6:20 AM  Result Value Ref Range   Glucose-Capillary 364 (H) 65 - 99 mg/dL  Prepare RBC     Status: None   Collection Time: 08/07/17  6:50 AM  Result Value Ref Range   Order Confirmation      ORDER PROCESSED BY BLOOD BANK Performed at [facility], [place] [address] [place],  [address]    Ct Abdomen Pelvis W Contrast  Result Date: 08/07/2017 CLINICAL DATA:  Emotionally distraught.  Depressed.  Melena. EXAM: CT ABDOMEN AND PELVIS WITH CONTRAST TECHNIQUE: Multidetector CT imaging of the abdomen and pelvis was performed using the standard protocol following bolus administration of intravenous contrast. CONTRAST:  125m ISOVUE-300 IOPAMIDOL (ISOVUE-300) INJECTION 61% COMPARISON:  None. FINDINGS: Lower chest: Lung bases are clear. Hepatobiliary: Probable hepatic cirrhosis with enlarged left lateral segment and caudate lobes. No focal liver lesions. Gallbladder and bile ducts are unremarkable. Pancreas: Unremarkable. No pancreatic ductal dilatation or  surrounding inflammatory changes. Spleen: Normal in size without focal abnormality. Adrenals/Urinary Tract: Adrenal glands are unremarkable. Kidneys are normal, without renal calculi, focal lesion, or hydronephrosis. Bladder is unremarkable. Stomach/Bowel: Stomach, small bowel, and colon are not abnormally distended. No wall thickening or inflammatory changes appreciated. Appendix is not identified. Vascular/Lymphatic: Scattered calcification of the abdominal aorta. No aneurysm. Retroperitoneal lymph nodes are not pathologically enlarged. Reproductive: Prostate gland is enlarged, measuring 5.3 cm diameter. Other: Diffuse free fluid throughout the abdomen and pelvis consistent with moderate ascites. No free air. Abdominal wall musculature appears intact. Musculoskeletal: No acute or significant osseous findings. IMPRESSION: 1. Configuration of the liver is consistent with hepatic cirrhosis. 2. Diffuse abdominal and pelvic ascites. 3. Enlarged prostate gland. 4. Aortic atherosclerosis. Electronically Signed   By: WLucienne CapersM.D.   On: 08/07/2017 05:58    Pending Labs Unresulted Labs (From admission, onward)   Start     Ordered   08/07/17 0325  Occult blood card to lab, stool  Once,   STAT     08/07/17 0325   Signed and Held  HIV antibody (Routine Testing)  Once,   R     Signed and Held   Signed and Held  Hemoglobin A1c  Once,   R    Comments:  To assess prior glycemic control    Signed and Held   Signed and Held  CBC  Tomorrow morning,   R     Signed and Held   Signed and Held  Comprehensive metabolic panel  Tomorrow morning,   R     Signed and Held      Vitals/Pain Today's Vitals  08/07/17 0255 08/07/17 0603 08/07/17 0733 08/07/17 0800  BP:  (!) 94/51 110/64 113/72  Pulse:  (!) 106 96 94  Resp:  '18 20 14  ' Temp:      TempSrc:      SpO2:  98% 97% 98%  Weight: 198 lb (89.8 kg)     Height: '5\' 6"'  (1.676 m)     PainSc:        Isolation Precautions No active  isolations  Medications Medications  pantoprazole (PROTONIX) 80 mg in sodium chloride 0.9 % 250 mL (0.32 mg/mL) infusion (8 mg/hr Intravenous New Bag/Given 08/07/17 0758)  pantoprazole (PROTONIX) injection 40 mg (has no administration in time range)  0.9 %  sodium chloride infusion ( Intravenous New Bag/Given 08/07/17 0800)  iopamidol (ISOVUE-300) 61 % injection 100 mL (100 mLs Intravenous Contrast Given 08/07/17 0540)  sodium chloride 0.9 % bolus 1,000 mL (0 mLs Intravenous Stopped 08/07/17 0757)  pantoprazole (PROTONIX) 80 mg in sodium chloride 0.9 % 100 mL IVPB (0 mg Intravenous Stopped 08/07/17 0757)    Mobility walks with person assist

## 2017-08-07 NOTE — ED Triage Notes (Signed)
Patient called EMS due to being emotionally distraught. Patient states fiance left him a few days ago and he has been depressed ever since. EMS states fire got a CBG of 11 upon arrival, so 25 grams of D10 and 15 grams of instaglucose were given. Patient's last CBG with EMS was 491.  Vitals BP 98/50 P 106 RR 16 SpO2 100 RA

## 2017-08-07 NOTE — ED Provider Notes (Signed)
[facility]-EMERGENCY DEPT Provider Note  CSN: [id number] Arrival date & time: 08/07/17 0231  Chief Complaint(s) Dizziness; Nausea; and Depression  Triage Note 0245 Patient called EMS due to being emotionally distraught. Patient states fiance left him a few days ago and he has been depressed ever since. EMS states fire got a CBG of 11 upon arrival, so 25 grams of D10 and 15 grams of instaglucose were given. Patient's last CBG with EMS was 491.   HPI George Bridges is a 62 y.o. male   The history is provided by the patient.  Dizziness  Quality:  Lightheadedness Severity:  Moderate Onset quality:  Gradual Duration:  2 days Timing:  Constant Progression:  Waxing and waning Context comment:  Decreased oral intake due to nausea. Relieved by:  Change in position Worsened by:  Lying down Associated symptoms: blood in stool (melena started several days ago) and nausea   Associated symptoms: no chest pain, no diarrhea, no palpitations, no shortness of breath, no syncope, no vision changes, no vomiting and no weakness    Denies SI, HI, or AHV.  Patient denies any alcohol use, IV drug use.   Past Medical History Past Medical History:  Diagnosis Date  . Diabetes mellitus without complication (HCC)   . Gout   . Sleep apnea    There are no active problems to display for this patient.  Home Medication(s) Prior to Admission medications   Medication Sig Start Date End Date Taking? Authorizing Provider  aspirin EC 325 MG tablet Take 325 mg by mouth daily.    [provider]  FLUoxetine (PROZAC) 20 MG tablet Take 1 tablet (20 mg total) by mouth daily. 06/23/12   Quita Skye, MD  metFORMIN (GLUCOPHAGE) 500 MG tablet Take 1 tablet (500 mg total) by mouth 2 (two) times daily with a meal. 06/23/12   Quita Skye, MD                                                                                                                                    Past Surgical  History History reviewed. No pertinent surgical history. Family History History reviewed. No pertinent family history.  Social History Social History   Tobacco Use  . Smoking status: Never Smoker  Substance Use Topics  . Alcohol use: No  . Drug use: No   Allergies Oxycodone  Review of Systems Review of Systems  Respiratory: Negative for shortness of breath.   Cardiovascular: Negative for chest pain, palpitations and syncope.  Gastrointestinal: Positive for blood in stool (melena started several days ago) and nausea. Negative for diarrhea and vomiting.  Neurological: Positive for dizziness. Negative for weakness.   All other systems are reviewed and are negative for acute change except as noted in the HPI  Physical Exam Vital Signs  I have reviewed the triage vital signs BP (!) 101/50   Pulse (!) 106   Temp 98.2 F (  36.8 C) (Oral)   Resp 16   Ht 5\' 6"  (1.676 m)   Wt 89.8 kg (198 lb)   SpO2 99%   BMI 31.96 kg/m   Physical Exam  Constitutional: He is oriented to person, place, and time. He appears well-developed and well-nourished. No distress.  HENT:  Head: Normocephalic and atraumatic.  Nose: Nose normal.  Eyes: Pupils are equal, round, and reactive to light. Conjunctivae and EOM are normal. Right eye exhibits no discharge. Left eye exhibits no discharge. No scleral icterus.  Neck: Normal range of motion. Neck supple.  Cardiovascular: Normal rate and regular rhythm. Exam reveals no gallop and no friction rub.  No murmur heard. Pulmonary/Chest: Effort normal and breath sounds normal. No stridor. No respiratory distress. He has no rales.  Abdominal: Soft. He exhibits distension (mild). There is generalized tenderness (mild discomfort). There is no rigidity, no rebound and no guarding.  Genitourinary: Rectal exam shows external hemorrhoid and guaiac positive stool. Prostate is enlarged. Prostate is not tender.  Musculoskeletal: He exhibits no edema or tenderness.    Neurological: He is alert and oriented to person, place, and time.  Skin: Skin is warm and dry. No rash noted. He is not diaphoretic. No erythema.  Psychiatric: He has a normal mood and affect.  Vitals reviewed.   ED Results and Treatments Labs (all labs ordered are listed, but only abnormal results are displayed) Labs Reviewed  CBC WITH DIFFERENTIAL/PLATELET - Abnormal; Notable for the following components:      Result Value   RBC 2.67 (*)    Hemoglobin 8.7 (*)    HCT 25.5 (*)    Platelets 97 (*)    All other components within normal limits  COMPREHENSIVE METABOLIC PANEL - Abnormal; Notable for the following components:   Sodium 131 (*)    Chloride 96 (*)    Glucose, Bld 432 (*)    BUN 52 (*)    Calcium 8.2 (*)    Total Protein 6.0 (*)    Albumin 2.8 (*)    AST 50 (*)    Total Bilirubin 1.4 (*)    All other components within normal limits  ACETAMINOPHEN LEVEL - Abnormal; Notable for the following components:   Acetaminophen (Tylenol), Serum <10 (*)    All other components within normal limits  POC OCCULT BLOOD, ED - Abnormal; Notable for the following components:   Fecal Occult Bld POSITIVE (*)    All other components within normal limits  CBG MONITORING, ED - Abnormal; Notable for the following components:   Glucose-Capillary 417 (*)    All other components within normal limits  CBG MONITORING, ED - Abnormal; Notable for the following components:   Glucose-Capillary 364 (*)    All other components within normal limits  SALICYLATE LEVEL  ETHANOL  OCCULT BLOOD X 1 CARD TO LAB, STOOL  RAPID URINE DRUG SCREEN, HOSP PERFORMED  TYPE AND SCREEN  ABO/RH  EKG  EKG Interpretation  Date/Time:  Sunday August 07 2017 03:56:05 EDT Ventricular Rate:  106 PR Interval:    QRS Duration: 99 QT Interval:  362 QTC Calculation: 481 R Axis:   49 Text  Interpretation:  Sinus tachycardia Borderline prolonged QT interval NO STEMI No old tracing to compare Confirmed by Drema Pry [phone]) on 08/07/2017 4:12:20 AM      Radiology Ct Abdomen Pelvis W Contrast  Result Date: 08/07/2017 CLINICAL DATA:  Emotionally distraught.  Depressed.  Melena. EXAM: CT ABDOMEN AND PELVIS WITH CONTRAST TECHNIQUE: Multidetector CT imaging of the abdomen and pelvis was performed using the standard protocol following bolus administration of intravenous contrast. CONTRAST:  ISOVUE-300 IOPAMIDOL (ISOVUE-300) INJECTION 61% COMPARISON:  None. FINDINGS: Lower chest: Lung bases are clear. Hepatobiliary: Probable hepatic cirrhosis with enlarged left lateral segment and caudate lobes. No focal liver lesions. Gallbladder and bile ducts are unremarkable. Pancreas: Unremarkable. No pancreatic ductal dilatation or surrounding inflammatory changes. Spleen: Normal in size without focal abnormality. Adrenals/Urinary Tract: Adrenal glands are unremarkable. Kidneys are normal, without renal calculi, focal lesion, or hydronephrosis. Bladder is unremarkable. Stomach/Bowel: Stomach, small bowel, and colon are not abnormally distended. No wall thickening or inflammatory changes appreciated. Appendix is not identified. Vascular/Lymphatic: Scattered calcification of the abdominal aorta. No aneurysm. Retroperitoneal lymph nodes are not pathologically enlarged. Reproductive: Prostate gland is enlarged, measuring 5.3 cm diameter. Other: Diffuse free fluid throughout the abdomen and pelvis consistent with moderate ascites. No free air. Abdominal wall musculature appears intact. Musculoskeletal: No acute or significant osseous findings. IMPRESSION: 1. Configuration of the liver is consistent with hepatic cirrhosis. 2. Diffuse abdominal and pelvic ascites. 3. Enlarged prostate gland. 4. Aortic atherosclerosis. Electronically Signed   By: Burman Nieves M.D.   On: 08/07/2017 05:58   Pertinent labs &  imaging results that were available during my care of the patient were reviewed by me and considered in my medical decision making (see chart for details).  Medications Ordered in ED Medications  iopamidol (ISOVUE-300) 61 % injection (has no administration in time range)  sodium chloride 0.9 % bolus 1,000 mL (1,000 mLs Intravenous New Bag/Given 08/07/17 0607)  iopamidol (ISOVUE-300) 61 % injection 100 mL (100 mLs Intravenous Contrast Given 08/07/17 0540)                                                                                                                                    Procedures Procedures  (including critical care time)  Medical Decision Making / ED Course I have reviewed the nursing notes for this encounter and the patient's prior records (if available in EHR or on provided paperwork).    Patient here with several days of generalized fatigue, lightheadedness and melena.  Work-up with positive Hemoccult and hemoglobin at 8.7, which is a seven-point drop from 5 years ago.  Abdomen with mild distention and diffuse discomfort without evidence of peritonitis.  CT scan was obtained revealing  ascites from likely liver disease.  Labs with mild hyponatremia, notable for hyperglycemia without evidence of DKA.  No renal insufficiency.  BUN elevated likely secondary to GI bleed.  Source of bleed not identified at this time.  Patient is hemodynamically stable at this time and does not require emergent transfusion.  Will discuss case with medicine for admission and continued work-up and management.    Final Clinical Impression(s) / ED Diagnoses Final diagnoses:  Melena  Blood loss anemia  Ascites due to alcoholic cirrhosis (HCC)      This chart was dictated using voice recognition software.  Despite best efforts to proofread,  errors can occur which can change the documentation meaning.   Nira Conn, MD 08/07/17 [phone]

## 2017-08-07 NOTE — Plan of Care (Signed)
  Problem: Education: Goal: Knowledge of General Education information will improve Outcome: Progressing   Problem: Health Behavior/Discharge Planning: Goal: Ability to manage health-related needs will improve Outcome: Progressing   Problem: Clinical Measurements: Goal: Ability to maintain clinical measurements within normal limits will improve Outcome: Progressing Goal: Will remain free from infection Outcome: Not Applicable Goal: Diagnostic test results will improve Outcome: Progressing Goal: Respiratory complications will improve Outcome: Not Applicable Goal: Cardiovascular complication will be avoided Outcome: Not Applicable   Problem: Coping: Goal: Level of anxiety will decrease Outcome: Progressing   Problem: Elimination: Goal: Will not experience complications related to bowel motility Outcome: Not Applicable Goal: Will not experience complications related to urinary retention Outcome: Not Applicable   Problem: Pain Managment: Goal: General experience of comfort will improve Outcome: Progressing   Problem: Safety: Goal: Ability to remain free from injury will improve Outcome: Progressing   Problem: Bleeding Precautions Goal: No active bleeding Outcome: Progressing

## 2017-08-07 NOTE — ED Notes (Signed)
Pt. Made aware for the need of urine specimen. 

## 2017-08-07 NOTE — ED Notes (Signed)
Patient too nauseated to stand up for orthostatic vitals.

## 2017-08-07 NOTE — Consult Note (Signed)
[facility] Consult  Referring Provider: Noralee Stain, DO Primary Care Physician:  Maudie Flakes, FNP Primary Gastroenterologist: Gentry Fitz  Reason for Consultation: Melena, anemia  HPI: George Bridges is a 62 y.o. male who was admitted today with c/o black stools and anemia. Patient states he was in his usual state of health until 2 to 3 days prior to presentation,when  he developed weakness along with dizziness.  Normally has a bowel movement every few days, however, for the last several days he reports several episodes of loose and black stools.  He denies vomiting blood or noticing rectal bleeding. Denies use of NSAIDs, takes aspirin 81 mg daily, no prior EGD or colonoscopy. Denies difficulty swallowing, pain on swallowing, unintentional weight loss. CAT scan on admission showed cirrhosis of liver-denies history of alcohol abuse, exposure to hepatitis B/hepatitis C or high risk behavior.   Past Medical History:  Diagnosis Date  . Diabetes mellitus without complication (HCC)   . Gout   . Sleep apnea     Past Surgical History:  Procedure Laterality Date  . KNEE ARTHROSCOPY Left   . ROTATOR CUFF REPAIR Bilateral     Prior to Admission medications   Medication Sig Start Date End Date Taking? Authorizing Provider  aspirin EC 81 MG tablet Take 81 mg by mouth daily.   Yes [provider]  glimepiride (AMARYL) 2 MG tablet Take 1 mg by mouth daily with breakfast.  05/23/17  Yes [provider]  FLUoxetine (PROZAC) 20 MG tablet Take 1 tablet (20 mg total) by mouth daily. Patient not taking: Reported on 08/07/2017 06/23/12   Quita Skye, MD  metFORMIN (GLUCOPHAGE) 500 MG tablet Take 1 tablet (500 mg total) by mouth 2 (two) times daily with a meal. 06/23/12   Quita Skye, MD    Current Facility-Administered Medications  Medication Dose Route Frequency Provider Last Rate Last Dose  . 0.9 %  sodium chloride infusion   Intravenous Continuous Noralee Stain,  DO   Stopped at 08/07/17 0930  . insulin aspart (novoLOG) injection 0-9 Units  0-9 Units Subcutaneous Q4H Noralee Stain, DO   5 Units at 08/07/17 1111  . octreotide (SANDOSTATIN) 500 mcg in sodium chloride 0.9 % 250 mL (2 mcg/mL) infusion  50 mcg/hr Intravenous Continuous Kerin Salen, MD      . ondansetron [facility]) tablet 4 mg  4 mg Oral Q6H PRN Noralee Stain, DO       Or  . ondansetron (ZOFRAN) injection 4 mg  4 mg Intravenous Q6H PRN Noralee Stain, DO      . pantoprazole (PROTONIX) 80 mg in sodium chloride 0.9 % 250 mL (0.32 mg/mL) infusion  8 mg/hr Intravenous Continuous Noralee Stain, DO 25 mL/hr at 08/07/17 0758 8 mg/hr at 08/07/17 0758  . [START ON 08/10/2017] pantoprazole (PROTONIX) injection 40 mg  40 mg Intravenous Q12H Noralee Stain, DO        Allergies as of 08/07/2017 - Review Complete 08/07/2017  Allergen Reaction Noted  . Oxycodone Hives 08/07/2017    Family History  Problem Relation Age of Onset  . Diabetes Father   . CAD Brother     Social History   Socioeconomic History  . Marital status: Single    Spouse name: Not on file  . Number of children: Not on file  . Years of education: Not on file  . Highest education level: Not on file  Occupational History  . Not on file  Social Needs  . Financial resource strain: Not very hard  .  Food insecurity:    Worry: Never true    Inability: Never true  . Transportation needs:    Medical: No    Non-medical: No  Tobacco Use  . Smoking status: Never Smoker  Substance and Sexual Activity  . Alcohol use: No  . Drug use: No  . Sexual activity: Not Currently    Partners: Female  Lifestyle  . Physical activity:    Days per week: Not on file    Minutes per session: Not on file  . Stress: Not on file  Relationships  . Social connections:    Talks on phone: Not on file    Gets together: Not on file    Attends religious service: Not on file    Active member of club or organization: Not on file    Attends meetings  of clubs or organizations: Not on file    Relationship status: Not on file  . Intimate partner violence:    Fear of current or ex partner: No    Emotionally abused: No    Physically abused: No    Forced sexual activity: No  Other Topics Concern  . Not on file  Social History Narrative  . Not on file    Review of Systems: Positive for: GI: Described in detail in HPI.    Gen: anorexia, fatigue, weakness, malaise, involuntary weight loss,Denies any fever, chills, rigors, night sweats,  and sleep disorder CV: Denies chest pain, angina, palpitations, syncope, orthopnea, PND, peripheral edema, and claudication. Resp: Denies dyspnea, cough, sputum, wheezing, coughing up blood. GU : Denies urinary burning, blood in urine, urinary frequency, urinary hesitancy, nocturnal urination, and urinary incontinence. MS: Denies joint pain or swelling.  Denies muscle weakness, cramps, atrophy.  Derm: Denies rash, itching, oral ulcerations, hives, unhealing ulcers.  Psych: Denies depression, anxiety, memory loss, suicidal ideation, hallucinations,  and confusion. Heme: Denies bruising, bleeding, and enlarged lymph nodes. Neuro:  Denies any headaches, dizziness, paresthesias. Endo:  DM,Denies any problems with  thyroid, adrenal function.  Physical Exam: Vital signs in last 24 hours: Temp:  [98 F (36.7 C)-98.6 F (37 C)] 98.4 F (36.9 C) (06/23 0944) Pulse Rate:  [85-106] 85 (06/23 1100) Resp:  [12-20] 16 (06/23 1100) BP: (91-113)/(33-72) 112/53 (06/23 1100) SpO2:  [95 %-99 %] 98 % (06/23 1100) Weight:  [89.8 kg (198 lb)-95.9 kg (211 lb 6.7 oz)] 95.9 kg (211 lb 6.7 oz) (06/23 0844) Last BM Date: 08/07/17(per report)  General:   Alert,  Well-developed, well-nourished, pleasant and cooperative in NAD Head:  Normocephalic and atraumatic. Eyes:  Sclera clear, no icterus.   Mild pallor Ears:  Normal auditory acuity. Nose:  No deformity, discharge,  or lesions. Mouth:  No deformity or lesions.   Oropharynx pink & moist. Neck:  Supple; no masses or thyromegaly. Lungs:  Clear throughout to auscultation.   No wheezes, crackles, or rhonchi. No acute distress. Heart:  Regular rate and rhythm; no murmurs, clicks, rubs,  or gallops. Extremities:  Without clubbing or edema. Neurologic:  Alert and  oriented x4;  grossly normal neurologically. Skin:  Intact without significant lesions or rashes. Psych:  Alert and cooperative. Normal mood and affect. Abdomen:  Soft, nontender and nondistended. No masses, hepatosplenomegaly or hernias noted. Normal bowel sounds, without guarding, and without rebound.         Lab Results: Recent Labs    08/07/17 0403  WBC 8.0  HGB 8.7*  HCT 25.5*  PLT 97*   BMET Recent Labs    08/07/17  0403  NA 131*  K 3.5  CL 96*  CO2 23  GLUCOSE 432*  BUN 52*  CREATININE 1.20  CALCIUM 8.2*   LFT Recent Labs    08/07/17 0403  PROT 6.0*  ALBUMIN 2.8*  AST 50*  ALT 45  ALKPHOS 57  BILITOT 1.4*   PT/INR No results for input(s): LABPROT, INR in the last 72 hours.  Studies/Results: Ct Abdomen Pelvis W Contrast  Result Date: 08/07/2017 CLINICAL DATA:  Emotionally distraught.  Depressed.  Melena. EXAM: CT ABDOMEN AND PELVIS WITH CONTRAST TECHNIQUE: Multidetector CT imaging of the abdomen and pelvis was performed using the standard protocol following bolus administration of intravenous contrast. CONTRAST:  100mL ISOVUE-300 IOPAMIDOL (ISOVUE-300) INJECTION 61% COMPARISON:  None. FINDINGS: Lower chest: Lung bases are clear. Hepatobiliary: Probable hepatic cirrhosis with enlarged left lateral segment and caudate lobes. No focal liver lesions. Gallbladder and bile ducts are unremarkable. Pancreas: Unremarkable. No pancreatic ductal dilatation or surrounding inflammatory changes. Spleen: Normal in size without focal abnormality. Adrenals/Urinary Tract: Adrenal glands are unremarkable. Kidneys are normal, without renal calculi, focal lesion, or hydronephrosis.  Bladder is unremarkable. Stomach/Bowel: Stomach, small bowel, and colon are not abnormally distended. No wall thickening or inflammatory changes appreciated. Appendix is not identified. Vascular/Lymphatic: Scattered calcification of the abdominal aorta. No aneurysm. Retroperitoneal lymph nodes are not pathologically enlarged. Reproductive: Prostate gland is enlarged, measuring 5.3 cm diameter. Other: Diffuse free fluid throughout the abdomen and pelvis consistent with moderate ascites. No free air. Abdominal wall musculature appears intact. Musculoskeletal: No acute or significant osseous findings. IMPRESSION: 1. Configuration of the liver is consistent with hepatic cirrhosis. 2. Diffuse abdominal and pelvic ascites. 3. Enlarged prostate gland. 4. Aortic atherosclerosis. Electronically Signed   By: Burman NievesWilliam  Stevens M.D.   On: 08/07/2017 05:58    Impression: 1. Melena, drop in Hb(13.3 in 1/18 at [facility]) 8.7 on presentation Suspicious for UGI bleeding , elevated BUN/Cr ratio of 52/1.2, FOBT positive stools, ?PUD vs varices vs portal hypertensive gastropathy 2. Newly diagnosed cirrhosis ?NASH Plt 97 Will send for INR to calculate MELDNa score. Will send labs for HBsAG,HCV AB, AMA, ASMA, Alpha 1 antitrypsin, ceruloplasmin level. Iron profile will not be sent, he is receiving blood transfusion  Plan: ON IV Protonix, will start IV octreotide drip Clear liquid diet, NPO post midnight for EGD in am The risk and benefits of the procedure were discussed with the patient in detail. He understands and verbalizes consent.   LOS: 0 days   Kerin SalenArya Kert Shackett, M.D.Amadeo Garneticcardi, MD  08/07/2017, 11:45 AM  Pager [phone] If no answer or after 5 PM call [phone]

## 2017-08-07 NOTE — ED Notes (Signed)
[facility]

## 2017-08-07 NOTE — H&P (Signed)
History and Physical    Adiel Erney WJX:[id number] DOB: November 16, 1955 DOA: 08/07/2017  PCP: Maudie Flakes, FNP  Patient coming from: Home  Chief Complaint: Nausea, weakness, lightheadedness   HPI: George Bridges is a 62 y.o. male with medical history significant of DM, depression who presents after calling EMS for emotional distress.  Patient states that his fiance had broken off the engagement.  When EMS arrived at scene, patient's glucose was noted to be 11.  He was given 25 g of D10 and 15 g of Insta-Glucose.  Repeat blood sugar was 491.  Patient denies any thoughts of harming himself or suicidal ideations.  He states that he does take glimepiride daily, but denies that he took more than prescribed. He also states that for the past 3 days, he has noticed constant, loose, black stools.  He admits to nausea, weakness, lightheadedness.  He denies any vomiting or abdominal pain.  He does take a baby aspirin daily.  He admits to intermittent ibuprofen use, last use was about 3 weeks ago, and does not use this on a daily basis.  He denies any alcohol use.  He states that he has never had a colonoscopy or an upper GI scope in the past.  ED Course: Labs revealed hemoglobin 8.7 with positive FOBT.  His previous hemoglobin from [facility] was 13.3 in January 2018.  Patient was started on IV Protonix, 1 unit packed red blood cell ordered in the emergency room.  Review of Systems: As per HPI otherwise 10 point review of systems negative.   Past Medical History:  Diagnosis Date  . Diabetes mellitus without complication (HCC)   . Gout   . Sleep apnea     Past Surgical History:  Procedure Laterality Date  . KNEE ARTHROSCOPY Left   . ROTATOR CUFF REPAIR Bilateral      reports that he has never smoked. He does not have any smokeless tobacco history on file. He reports that he does not drink alcohol or use drugs.  Allergies  Allergen Reactions  . Oxycodone Hives    Family History  Problem  Relation Age of Onset  . Diabetes Father   . CAD Brother     Prior to Admission medications   Medication Sig Start Date End Date Taking? Authorizing Provider  aspirin EC 325 MG tablet Take 325 mg by mouth daily.    [provider]  FLUoxetine (PROZAC) 20 MG tablet Take 1 tablet (20 mg total) by mouth daily. 06/23/12   Quita Skye, MD  metFORMIN (GLUCOPHAGE) 500 MG tablet Take 1 tablet (500 mg total) by mouth 2 (two) times daily with a meal. 06/23/12   Quita Skye, MD    Physical Exam: Vitals:   08/07/17 0251 08/07/17 0255 08/07/17 0603 08/07/17 0733  BP: (!) 101/50  (!) 94/51 110/64  Pulse: (!) 106  (!) 106 96  Resp: 16  18 20   Temp: 98.2 F (36.8 C)     TempSrc: Oral     SpO2: 99%  98% 97%  Weight:  89.8 kg (198 lb)    Height:  5\' 6"  (1.676 m)      Constitutional: NAD, calm, comfortable Eyes: PERRL, lids and conjunctivae normal ENMT: Mucous membranes are moist. Posterior pharynx clear of any exudate or lesions.Normal dentition.  Neck: normal, supple, no masses, no thyromegaly Respiratory: clear to auscultation bilaterally, no wheezing, no crackles. Normal respiratory effort. No accessory muscle use.  Cardiovascular: Regular rate and rhythm, no murmurs / rubs / gallops. No  extremity edema.  Abdomen: no tenderness, no masses palpated. Bowel sounds positive. Soft.  Musculoskeletal: no clubbing / cyanosis. No joint deformity upper and lower extremities. Good ROM, no contractures. Normal muscle tone.  Skin: no rashes, lesions, ulcers. No induration Neurologic: CN 2-12 grossly intact. Strength 5/5 in all 4.  Psychiatric: Normal judgment and insight. Alert and oriented x 3. Normal mood.   Labs on Admission: I have personally reviewed following labs and imaging studies  CBC: Recent Labs  Lab 08/07/17 0403  WBC 8.0  NEUTROABS 4.6  HGB 8.7*  HCT 25.5*  MCV 95.5  PLT 97*   Basic Metabolic Panel: Recent Labs  Lab 08/07/17 0403  NA 131*  K 3.5  CL 96*  CO2 23    GLUCOSE 432*  BUN 52*  CREATININE 1.20  CALCIUM 8.2*   GFR: Estimated Creatinine Clearance: 67.8 mL/min (by C-G formula based on SCr of 1.2 mg/dL). Liver Function Tests: Recent Labs  Lab 08/07/17 0403  AST 50*  ALT 45  ALKPHOS 57  BILITOT 1.4*  PROT 6.0*  ALBUMIN 2.8*   No results for input(s): LIPASE, AMYLASE in the last 168 hours. No results for input(s): AMMONIA in the last 168 hours. Coagulation Profile: No results for input(s): INR, PROTIME in the last 168 hours. Cardiac Enzymes: No results for input(s): CKTOTAL, CKMB, CKMBINDEX, TROPONINI in the last 168 hours. BNP (last 3 results) No results for input(s): PROBNP in the last 8760 hours. HbA1C: No results for input(s): HGBA1C in the last 72 hours. CBG: Recent Labs  Lab 08/07/17 0333 08/07/17 0620  GLUCAP 417* 364*   Lipid Profile: No results for input(s): CHOL, HDL, LDLCALC, TRIG, CHOLHDL, LDLDIRECT in the last 72 hours. Thyroid Function Tests: No results for input(s): TSH, T4TOTAL, FREET4, T3FREE, THYROIDAB in the last 72 hours. Anemia Panel: No results for input(s): VITAMINB12, FOLATE, FERRITIN, TIBC, IRON, RETICCTPCT in the last 72 hours. Urine analysis:    Component Value Date/Time   COLORURINE YELLOW 06/22/2012 1544   APPEARANCEUR CLEAR 06/22/2012 1544   LABSPEC 1.040 (H) 06/22/2012 1544   PHURINE 6.0 06/22/2012 1544   GLUCOSEU >1000 (A) 06/22/2012 1544   HGBUR NEGATIVE 06/22/2012 1544   BILIRUBINUR NEGATIVE 06/22/2012 1544   KETONESUR NEGATIVE 06/22/2012 1544   PROTEINUR NEGATIVE 06/22/2012 1544   UROBILINOGEN 1.0 06/22/2012 1544   NITRITE NEGATIVE 06/22/2012 1544   LEUKOCYTESUR NEGATIVE 06/22/2012 1544   Sepsis Labs: !!!!!!!!!!!!!!!!!!!!!!!!!!!!!!!!!!!!!!!!!!!! @LABRCNTIP (procalcitonin:4,lacticidven:4) )No results found for this or any previous visit (from the past 240 hour(s)).   Radiological Exams on Admission: Ct Abdomen Pelvis W Contrast  Result Date: 08/07/2017 CLINICAL DATA:   Emotionally distraught.  Depressed.  Melena. EXAM: CT ABDOMEN AND PELVIS WITH CONTRAST TECHNIQUE: Multidetector CT imaging of the abdomen and pelvis was performed using the standard protocol following bolus administration of intravenous contrast. CONTRAST:  100mL ISOVUE-300 IOPAMIDOL (ISOVUE-300) INJECTION 61% COMPARISON:  None. FINDINGS: Lower chest: Lung bases are clear. Hepatobiliary: Probable hepatic cirrhosis with enlarged left lateral segment and caudate lobes. No focal liver lesions. Gallbladder and bile ducts are unremarkable. Pancreas: Unremarkable. No pancreatic ductal dilatation or surrounding inflammatory changes. Spleen: Normal in size without focal abnormality. Adrenals/Urinary Tract: Adrenal glands are unremarkable. Kidneys are normal, without renal calculi, focal lesion, or hydronephrosis. Bladder is unremarkable. Stomach/Bowel: Stomach, small bowel, and colon are not abnormally distended. No wall thickening or inflammatory changes appreciated. Appendix is not identified. Vascular/Lymphatic: Scattered calcification of the abdominal aorta. No aneurysm. Retroperitoneal lymph nodes are not pathologically enlarged. Reproductive: Prostate gland is enlarged,  measuring 5.3 cm diameter. Other: Diffuse free fluid throughout the abdomen and pelvis consistent with moderate ascites. No free air. Abdominal wall musculature appears intact. Musculoskeletal: No acute or significant osseous findings. IMPRESSION: 1. Configuration of the liver is consistent with hepatic cirrhosis. 2. Diffuse abdominal and pelvic ascites. 3. Enlarged prostate gland. 4. Aortic atherosclerosis. Electronically Signed   By: Burman Nieves M.D.   On: 08/07/2017 05:58    EKG: Independently reviewed. Sinus tachycardia without ST changes   Assessment/Plan Principal Problem:   GI bleed Active Problems:   Other cirrhosis of liver (HCC)   DM type 2 (diabetes mellitus, type 2) (HCC)   HTN (hypertension)  GI bleed, melena, with  symptomatic anemia  -Hgb 8.7 from baseline 13.3 in Jan 2018 at [facility]   -FOBT positive  -1u PRBC ordered by ED  -PPI IV  -Hold aspirin  -GI consulted, spoke with Dr. Marca Ancona   Cirrhosis -No hx of alcohol abuse   DM type 2 -Hold home glimepiride and metformin. Patient did present with hypoglycemia which has resolved with tx  -SSI  HTN -Hold cozaar until BP stabilizes in setting of GI bleed    DVT prophylaxis: SCD Code Status: Full  Family Communication: No family at bedside Disposition Plan: Pending GI consultation Consults called: [facility] Dr. Marca Ancona   Admission status: Inpatient   * I certify that at the point of admission it is my clinical judgment that the patient will require inpatient hospital care spanning beyond 2 midnights from the point of admission due to high intensity of service, high risk for further deterioration and high frequency of surveillance required.*   Noralee Stain, DO [facility] [email or web address] Password [facility] 08/07/2017, 8:04 AM

## 2017-08-08 ENCOUNTER — Encounter (HOSPITAL_COMMUNITY)
Admission: EM | Disposition: A | Discharge status: Home / Self Care | Payer: Self-pay | Source: Home / Self Care | Attending: Internal Medicine

## 2017-08-08 ENCOUNTER — Encounter (HOSPITAL_COMMUNITY): Payer: Self-pay

## 2017-08-08 ENCOUNTER — Inpatient Hospital Stay (HOSPITAL_COMMUNITY): Payer: Medicare Other | Admitting: Anesthesiology

## 2017-08-08 DIAGNOSIS — K7031 Alcoholic cirrhosis of liver with ascites: Secondary | ICD-10-CM

## 2017-08-08 HISTORY — PX: ESOPHAGOGASTRODUODENOSCOPY (EGD) WITH PROPOFOL: SHX5813

## 2017-08-08 HISTORY — PX: ESOPHAGEAL BANDING: SHX5518

## 2017-08-08 HISTORY — PX: BIOPSY: SHX5522

## 2017-08-08 LAB — GLUCOSE, CAPILLARY
GLUCOSE-CAPILLARY: 224 mg/dL — AB (ref 65–99)
GLUCOSE-CAPILLARY: 215 mg/dL — AB (ref 65–99)
GLUCOSE-CAPILLARY: 222 mg/dL — AB (ref 65–99)
Glucose-Capillary: 224 mg/dL — ABNORMAL HIGH (ref 65–99)
Glucose-Capillary: 200 mg/dL — ABNORMAL HIGH (ref 65–99)
Glucose-Capillary: 203 mg/dL — ABNORMAL HIGH (ref 65–99)
Glucose-Capillary: 230 mg/dL — ABNORMAL HIGH (ref 65–99)
Glucose-Capillary: 258 mg/dL — ABNORMAL HIGH (ref 65–99)

## 2017-08-08 LAB — CBC
HCT: 25.8 % — ABNORMAL LOW (ref 39.0–52.0)
HEMOGLOBIN: 8.8 g/dL — AB (ref 13.0–17.0)
MCH: 32.4 pg (ref 26.0–34.0)
MCHC: 34.1 g/dL (ref 30.0–36.0)
MCV: 94.9 fL (ref 78.0–100.0)
PLATELETS: 79 10*3/uL — AB (ref 150–400)
RBC: 2.72 MIL/uL — AB (ref 4.22–5.81)
RDW: 15 % (ref 11.5–15.5)
WBC: 5 10*3/uL (ref 4.0–10.5)

## 2017-08-08 LAB — COMPREHENSIVE METABOLIC PANEL
ALBUMIN: 2.6 g/dL — AB (ref 3.5–5.0)
ALK PHOS: 52 U/L (ref 38–126)
ALT: 64 U/L — ABNORMAL HIGH (ref 17–63)
AST: 98 U/L — AB (ref 15–41)
Anion gap: 6 (ref 5–15)
BUN: 40 mg/dL — AB (ref 6–20)
CALCIUM: 7.7 mg/dL — AB (ref 8.9–10.3)
CHLORIDE: 104 mmol/L (ref 101–111)
CO2: 24 mmol/L (ref 22–32)
CREATININE: 1.16 mg/dL (ref 0.61–1.24)
GFR calc Af Amer: >60 mL/min (ref >60)
GFR calc non Af Amer: >60 mL/min (ref >60)
GLUCOSE: 235 mg/dL — AB (ref 65–99)
Potassium: 4.5 mmol/L (ref 3.5–5.1)
Sodium: 134 mmol/L — ABNORMAL LOW (ref 135–145)
Total Bilirubin: 1.7 mg/dL — ABNORMAL HIGH (ref 0.3–1.2)
Total Protein: 5.6 g/dL — ABNORMAL LOW (ref 6.5–8.1)

## 2017-08-08 LAB — HIV ANTIBODY (ROUTINE TESTING W REFLEX): HIV SCREEN 4TH GENERATION: NONREACTIVE

## 2017-08-08 LAB — ALPHA-1-ANTITRYPSIN: A1 ANTITRYPSIN SER: 136 mg/dL (ref 90–200)

## 2017-08-08 LAB — CERULOPLASMIN: CERULOPLASMIN: 19.8 mg/dL (ref 16.0–31.0)

## 2017-08-08 SURGERY — ESOPHAGOGASTRODUODENOSCOPY (EGD) WITH PROPOFOL
Anesthesia: Monitor Anesthesia Care

## 2017-08-08 MED ORDER — PROPOFOL 10 MG/ML IV BOLUS
INTRAVENOUS | Status: AC
  Filled 2017-08-08: qty 60

## 2017-08-08 MED ORDER — PROPOFOL 10 MG/ML IV BOLUS
INTRAVENOUS | Status: DC | PRN
  Administered 2017-08-08 (×3): 20 mg via INTRAVENOUS

## 2017-08-08 MED ORDER — LIDOCAINE 2% (20 MG/ML) 5 ML SYRINGE
INTRAMUSCULAR | Status: DC | PRN
  Administered 2017-08-08: 100 mg via INTRAVENOUS

## 2017-08-08 MED ORDER — SODIUM CHLORIDE 0.9 % IV SOLN: INTRAVENOUS | Status: DC

## 2017-08-08 MED ORDER — LACTATED RINGERS IV SOLN
INTRAVENOUS | Status: DC
  Administered 2017-08-08: 09:00:00 via INTRAVENOUS

## 2017-08-08 MED ORDER — PROPOFOL 500 MG/50ML IV EMUL
INTRAVENOUS | Status: DC | PRN
  Administered 2017-08-08: 120 ug/kg/min via INTRAVENOUS

## 2017-08-08 MED ORDER — PANTOPRAZOLE SODIUM 40 MG IV SOLR
40.0000 mg | Freq: Two times a day (BID) | INTRAVENOUS | Status: DC
  Administered 2017-08-08 – 2017-08-10 (×5): 40 mg via INTRAVENOUS
  Filled 2017-08-08 (×6): qty 40

## 2017-08-08 MED ORDER — PHENYLEPHRINE 40 MCG/ML (10ML) SYRINGE FOR IV PUSH (FOR BLOOD PRESSURE SUPPORT)
PREFILLED_SYRINGE | INTRAVENOUS | Status: DC | PRN
  Administered 2017-08-08 (×2): 80 ug via INTRAVENOUS

## 2017-08-08 SURGICAL SUPPLY — 15 items

## 2017-08-08 NOTE — Anesthesia Postprocedure Evaluation (Signed)
Anesthesia Post Note  Patient: George Bridges  Procedure(s) Performed: ESOPHAGOGASTRODUODENOSCOPY (EGD) WITH PROPOFOL (N/A ) BIOPSY ESOPHAGEAL BANDING     Patient location during evaluation: Endoscopy Anesthesia Type: MAC Level of consciousness: awake and alert Pain management: pain level controlled Vital Signs Assessment: post-procedure vital signs reviewed and stable Respiratory status: spontaneous breathing, nonlabored ventilation and respiratory function stable Cardiovascular status: stable and blood pressure returned to baseline Postop Assessment: no apparent nausea or vomiting Anesthetic complications: no    Last Vitals:  Vitals:   08/08/17 1020 08/08/17 1030  BP: (!) 108/58 (!) 100/57  Pulse: 78 77  Resp: 16 15  Temp:    SpO2: 98% 95%    Last Pain:  Vitals:   08/08/17 1002  TempSrc: Oral  PainSc: 0-No pain                 Lynda Rainwater

## 2017-08-08 NOTE — Progress Notes (Signed)
[facility] Progress Note  Truman HaywardRichard Dauenhauer 62 y.o. 03/18/1955  CC:  Melena, cirrhosis   Subjective: no acute overnight issues. Denied any black tarry stool since admission. Complaining of nausea, denied vomiting.     Objective: Vital signs in last 24 hours: Vitals:   08/08/17 0800 08/08/17 0836  BP: (!) 112/37 106/65  Pulse: 77 76  Resp: 15 19  Temp: 98.5 F (36.9 C) 98.6 F (37 C)  SpO2: 96% 95%    Physical Exam:  Gen. Alert/oriented 3. Not in acute distress Abdomen. Distended, bowel sounds present, no peritoneal signs. Lower extremity. No significant edema Psych : mood and affect normal.  Lab Results: Recent Labs    08/07/17 0403 08/08/17 0337  NA 131* 134*  K 3.5 4.5  CL 96* 104  CO2 23 24  GLUCOSE 432* 235*  BUN 52* 40*  CREATININE 1.20 1.16  CALCIUM 8.2* 7.7*   Recent Labs    08/07/17 0403 08/08/17 0337  AST 50* 98*  ALT 45 64*  ALKPHOS 57 52  BILITOT 1.4* 1.7*  PROT 6.0* 5.6*  ALBUMIN 2.8* 2.6*   Recent Labs    08/07/17 0403 08/07/17 1432 08/08/17 0337  WBC 8.0  --  5.0  NEUTROABS 4.6  --   --   HGB 8.7* 9.1* 8.8*  HCT 25.5* 26.1* 25.8*  MCV 95.5  --  94.9  PLT 97*  --  79*   Recent Labs    08/07/17 1432  LABPROT 17.1*  INR 1.41      Assessment/Plan: - melena in setting of CT scan showing cirrhosis.no significant NSAID use. 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 Hemoglobin stable - newly diagnosed cirrhosis. MELD score 17.  Denies alcohol use although AST > ALT , concerning for underlying alcohol use.   Recommendations ------------------------- - EGD today. - Follow  HBsAG,HCV AB, AMA, ASMA, Alpha 1 antitrypsin, ceruloplasmin level. - paracentesis with fluid analysis after EGD. - consider starting antibiotics after paracentesis. - GI will follow.  Kathi DerParag Albie Arizpe MD, FACP 08/08/2017, 8:51 AM  Contact #  [phone]

## 2017-08-08 NOTE — Brief Op Note (Signed)
08/07/2017 - 08/08/2017  9:58 AM  PATIENT:  George Bridges  62 y.o. male  PRE-OPERATIVE DIAGNOSIS:  melena,anemia  POST-OPERATIVE DIAGNOSIS:  gastritis,esophageal varices banding,gastric and duodenal ulcer  PROCEDURE:  Procedure(s) with comments: ESOPHAGOGASTRODUODENOSCOPY (EGD) WITH PROPOFOL (N/A) - possible banding BIOPSY ESOPHAGEAL BANDING  SURGEON:  Surgeon(s) and Role:    * Kandy Towery, MD - Primary  Findings ----------- - EGD showed large esophageal varices. 3 bands placed. It also showed Diffuse hemorrhagic gastritis,superficial/linear gastric and duodenal bulb ulcers. Biopsies taken for H. Pylori.   Recommendations --------------------------- - Clear liquid diet today - Continue octreotide - Change Protonix to IV twice a day - Repeat CBC in the morning - Ultrasound paracentesis with fluid analysis. - GI will follow  Kathi DerParag Bertel Venard MD, FACP 08/08/2017, 9:59 AM  Contact #  [phone]

## 2017-08-08 NOTE — Progress Notes (Signed)
PROGRESS NOTE    George Bridges  ZOX:[id number] DOB: 12-19-1955 DOA: 08/07/2017 PCP: Maudie Flakes, FNP    Brief Narrative:  62 y.o. male with medical history significant of DM, depression who presents after calling EMS for emotional distress.  Patient states that his fiance had broken off the engagement.  When EMS arrived at scene, patient's glucose was noted to be 11.  He was given 25 g of D10 and 15 g of Insta-Glucose.  Repeat blood sugar was 491.  Patient denies any thoughts of harming himself or suicidal ideations.  He states that he does take glimepiride daily, but denies that he took more than prescribed. He also states that for the past 3 days, he has noticed constant, loose, black stools  Gi on board for Melena, cirrhosis  Assessment & Plan:   Principal Problem:   GI bleed - Management per GI. Pt is s/p EGD Per GI: - Follow  HBsAG,HCV AB, AMA, ASMA, Alpha 1 antitrypsin, ceruloplasmin level. - paracentesis with fluid analysis after EGD. - consider starting antibiotics after paracentesis. - GI will follow.  Findings ----------- - EGD showed large esophageal varices. 3 bands placed. It also showed Diffuse hemorrhagic gastritis,superficial/linear gastric and duodenal bulb ulcers. Biopsies taken for H. Pylori.  Plan per GI:  - Clear liquid diet today - Continue octreotide - Change Protonix to IV twice a day - Repeat CBC in the morning - Ultrasound paracentesis with fluid analysis. - GI will follow  No fever or elevated wbc level will defer antibiotic initiation to GI.  Active Problems:   Other cirrhosis of liver (HCC) - GI following    DM type 2 (diabetes mellitus, type 2) (HCC) - continue SSI - Diet per GI    HTN (hypertension) - stable off antihypertensives   DVT prophylaxis: SCD's Code Status: Full Family Communication: none at bedside. Disposition Plan: continue to monitor closely in stepdown unit.   Consultants:   GI: Dr. Marca Ancona   Procedures:  EGD   Antimicrobials: None   Subjective: The patient has no new complaints. No acute issues overnight reported.  Objective: Vitals:   08/08/17 1002 08/08/17 1010 08/08/17 1020 08/08/17 1030  BP: (!) 93/50 (!) 100/58 (!) 108/58 (!) 100/57  Pulse: 76 79 78 77  Resp: 17 19 16 15   Temp: 98.3 F (36.8 C)     TempSrc: Oral     SpO2: 98% 98% 98% 95%  Weight:      Height:        Intake/Output Summary (Last 24 hours) at 08/08/2017 1222 Last data filed at 08/08/2017 0959 Gross per 24 hour  Intake 4026 ml  Output 925 ml  Net 3101 ml   Filed Weights   08/07/17 0255 08/07/17 0844 08/08/17 0836  Weight: 89.8 kg (198 lb) 95.9 kg (211 lb 6.7 oz) 95.7 kg (211 lb)    Examination:  General exam: Appears calm and comfortable, in nad. Respiratory system: Clear to auscultation. Respiratory effort normal. Equal chest rise. Cardiovascular system: S1 & S2 heard, RRR. No JVD, murmurs, rubs Gastrointestinal system: Abdomen is distended, soft and nontender. Hypoactive bowel sounds. Central nervous system: Alert and oriented. No focal neurological deficits. Extremities: Symmetric 5 x 5 power. Skin: No rashes, lesions or ulcers, on limited exam Psychiatry: Judgement and insight appear normal. Mood & affect appropriate.     Data Reviewed: I have personally reviewed following labs and imaging studies  CBC: Recent Labs  Lab 08/07/17 0403 08/07/17 1432 08/08/17 0337  WBC 8.0  --  5.0  NEUTROABS 4.6  --   --   HGB 8.7* 9.1* 8.8*  HCT 25.5* 26.1* 25.8*  MCV 95.5  --  94.9  PLT 97*  --  79*   Basic Metabolic Panel: Recent Labs  Lab 08/07/17 0403 08/08/17 0337  NA 131* 134*  K 3.5 4.5  CL 96* 104  CO2 23 24  GLUCOSE 432* 235*  BUN 52* 40*  CREATININE 1.20 1.16  CALCIUM 8.2* 7.7*   GFR: Estimated Creatinine Clearance: 72.5 mL/min (by C-G formula based on SCr of 1.16 mg/dL). Liver Function Tests: Recent Labs  Lab 08/07/17 0403 08/08/17 0337  AST 50* 98*  ALT 45 64*    ALKPHOS 57 52  BILITOT 1.4* 1.7*  PROT 6.0* 5.6*  ALBUMIN 2.8* 2.6*   No results for input(s): LIPASE, AMYLASE in the last 168 hours. No results for input(s): AMMONIA in the last 168 hours. Coagulation Profile: Recent Labs  Lab 08/07/17 1432  INR 1.41   Cardiac Enzymes: No results for input(s): CKTOTAL, CKMB, CKMBINDEX, TROPONINI in the last 168 hours. BNP (last 3 results) No results for input(s): PROBNP in the last 8760 hours. HbA1C: Recent Labs    08/07/17 0921  HGBA1C 11.4*   CBG: Recent Labs  Lab 08/07/17 1302 08/07/17 1636 08/07/17 2328 08/08/17 0344 08/08/17 0839  GLUCAP 243* 260* 229* 224* 200*   Lipid Profile: No results for input(s): CHOL, HDL, LDLCALC, TRIG, CHOLHDL, LDLDIRECT in the last 72 hours. Thyroid Function Tests: No results for input(s): TSH, T4TOTAL, FREET4, T3FREE, THYROIDAB in the last 72 hours. Anemia Panel: No results for input(s): VITAMINB12, FOLATE, FERRITIN, TIBC, IRON, RETICCTPCT in the last 72 hours. Sepsis Labs: No results for input(s): PROCALCITON, LATICACIDVEN in the last 168 hours.  Recent Results (from the past 240 hour(s))  MRSA PCR Screening     Status: None   Collection Time: 08/07/17  8:58 AM  Result Value Ref Range Status   MRSA by PCR NEGATIVE NEGATIVE Final    Comment:        The GeneXpert MRSA Assay (FDA approved for NASAL specimens only), is one component of a comprehensive MRSA colonization surveillance program. It is not intended to diagnose MRSA infection nor to guide or monitor treatment for MRSA infections. Performed at [facility], [address] [place], [place] [address]          Radiology Studies: Ct Abdomen Pelvis W Contrast  Result Date: 08/07/2017 CLINICAL DATA:  Emotionally distraught.  Depressed.  Melena. EXAM: CT ABDOMEN AND PELVIS WITH CONTRAST TECHNIQUE: Multidetector CT imaging of the abdomen and pelvis was performed using the standard protocol following bolus  administration of intravenous contrast. CONTRAST:  100mL ISOVUE-300 IOPAMIDOL (ISOVUE-300) INJECTION 61% COMPARISON:  None. FINDINGS: Lower chest: Lung bases are clear. Hepatobiliary: Probable hepatic cirrhosis with enlarged left lateral segment and caudate lobes. No focal liver lesions. Gallbladder and bile ducts are unremarkable. Pancreas: Unremarkable. No pancreatic ductal dilatation or surrounding inflammatory changes. Spleen: Normal in size without focal abnormality. Adrenals/Urinary Tract: Adrenal glands are unremarkable. Kidneys are normal, without renal calculi, focal lesion, or hydronephrosis. Bladder is unremarkable. Stomach/Bowel: Stomach, small bowel, and colon are not abnormally distended. No wall thickening or inflammatory changes appreciated. Appendix is not identified. Vascular/Lymphatic: Scattered calcification of the abdominal aorta. No aneurysm. Retroperitoneal lymph nodes are not pathologically enlarged. Reproductive: Prostate gland is enlarged, measuring 5.3 cm diameter. Other: Diffuse free fluid throughout the abdomen and pelvis consistent with moderate ascites. No free air. Abdominal wall musculature appears  intact. Musculoskeletal: No acute or significant osseous findings. IMPRESSION: 1. Configuration of the liver is consistent with hepatic cirrhosis. 2. Diffuse abdominal and pelvic ascites. 3. Enlarged prostate gland. 4. Aortic atherosclerosis. Electronically Signed   By: Burman Nieves M.D.   On: 08/07/2017 05:58    Scheduled Meds: . insulin aspart  0-9 Units Subcutaneous Q4H  . pantoprazole  40 mg Intravenous Q12H   Continuous Infusions: . sodium chloride 75 mL/hr at 08/08/17 1206  . octreotide  (SANDOSTATIN)    IV infusion 50 mcg/hr (08/08/17 0917)     LOS: 1 day    Time spent: 36 min  Penny Pia, MD [facility] Pager [phone]  If 7PM-7AM, please contact night-coverage [email or web address] Password [facility] 08/08/2017, 12:22 PM

## 2017-08-08 NOTE — Anesthesia Preprocedure Evaluation (Signed)
Anesthesia Evaluation  Patient identified by MRN, date of birth, ID band Patient awake    Reviewed: Allergy & Precautions, NPO status , Patient's Chart, lab work & pertinent test results  Airway Mallampati: II  TM Distance: >3 FB Neck ROM: Full    Dental no notable dental hx.    Pulmonary neg pulmonary ROS, sleep apnea ,    Pulmonary exam normal breath sounds clear to auscultation       Cardiovascular hypertension, Pt. on medications negative cardio ROS Normal cardiovascular exam Rhythm:Regular Rate:Normal     Neuro/Psych negative neurological ROS  negative psych ROS   GI/Hepatic negative GI ROS, Neg liver ROS,   Endo/Other  negative endocrine ROSdiabetes, Type 2, Oral Hypoglycemic Agents  Renal/GU negative Renal ROS  negative genitourinary   Musculoskeletal negative musculoskeletal ROS (+)   Abdominal   Peds negative pediatric ROS (+)  Hematology negative hematology ROS (+) anemia ,   Anesthesia Other Findings   Reproductive/Obstetrics negative OB ROS                             Anesthesia Physical Anesthesia Plan  ASA: III  Anesthesia Plan: MAC   Post-op Pain Management:    Induction: Intravenous  PONV Risk Score and Plan: 1 and Treatment may vary due to age or medical condition  Airway Management Planned: Nasal Cannula  Additional Equipment:   Intra-op Plan:   Post-operative Plan:   Informed Consent: I have reviewed the patients History and Physical, chart, labs and discussed the procedure including the risks, benefits and alternatives for the proposed anesthesia with the patient or authorized representative who has indicated his/her understanding and acceptance.   Dental advisory given  Plan Discussed with: CRNA  Anesthesia Plan Comments:         Anesthesia Quick Evaluation

## 2017-08-08 NOTE — Op Note (Signed)
[facility] Patient Name: George Bridges Procedure Date: 08/08/2017 MRN: [id number] Attending MD: Kathi Der , MD Date of Birth: Dec 15, 1955 CSN: [id number] Age: 62 Admit Type: Inpatient Procedure:                Upper GI endoscopy Indications:              Melena, Cirrhosis with suspected esophageal varices Providers:                Kathi Der, MD, Dwain Sarna, RN, Beryle Beams, Technician, Leroy Libman, CRNA Referring MD:              Medicines:                Sedation Administered by an Anesthesia Professional Complications:            No immediate complications. Estimated Blood Loss:     Estimated blood loss was minimal. Procedure:                Pre-Anesthesia Assessment:                           - Prior to the procedure, a History and Physical                            was performed, and patient medications and                            allergies were reviewed. The patient's tolerance of                            previous anesthesia was also reviewed. The risks                            and benefits of the procedure and the sedation                            options and risks were discussed with the patient.                            All questions were answered, and informed consent                            was obtained. Prior Anticoagulants: The patient has                            taken no previous anticoagulant or antiplatelet                            agents. ASA Grade Assessment: III - A patient with                            severe systemic disease. After reviewing the risks  and benefits, the patient was deemed in                            satisfactory condition to undergo the procedure.                           After obtaining informed consent, the endoscope was                            passed under direct vision. Throughout the                            procedure, the  patient's blood pressure, pulse, and                            oxygen saturations were monitored continuously. The                            [id number] ([id number]) scope was introduced through the                            mouth, and advanced to the second part of duodenum.                            The upper GI endoscopy was accomplished without                            difficulty. The patient tolerated the procedure                            well. Scope In: Scope Out: Findings:      Few non-bleeding linear gastric ulcers were found in the prepyloric       region of the stomach. Biopsies were taken with a cold forceps for       histology.      Segmental severe inflammation with hemorrhage characterized by erosions       and erythema was found in the entire examined stomach.      Red blood was found in the gastric fundus.      Few linear duodenal ulcers were found in the duodenal bulb.      The first portion of the duodenum and second portion of the duodenum       were normal.      Three columns of non-bleeding large (> 5 mm) varices were found in the       distal esophagus,. No red wale signs were present. Three bands were       successfully placed with complete eradication, resulting in deflation of       varices. There was no bleeding during, and at the end, of the procedure.      Esophagogastric landmarks were identified: the gastroesophageal junction       was found at 40 cm from the incisors. Impression:               - Non-bleeding gastric ulcers. Biopsied.                           -  Gastritis with hemorrhage.                           - Red blood in the gastric fundus.                           - Multiple duodenal ulcers.                           - Normal first portion of the duodenum and second                            portion of the duodenum.                           - Non-bleeding large (> 5 mm) esophageal varices.                            Completely eradicated.  Banded. Moderate Sedation:      Moderate (conscious) sedation was personally administered by an       anesthesia professional. The following parameters were monitored: oxygen       saturation, heart rate, blood pressure, and response to care. Recommendation:           - Return patient to hospital ward for ongoing care.                           - Clear liquid diet.                           - Continue present medications.                           - Await pathology results.                           - Repeat upper endoscopy in 4 weeks for retreatment. Procedure Code(s):        --- Professional ---                           [id number], Esophagogastroduodenoscopy, flexible,                            transoral; with band ligation of esophageal/gastric                            varices                           43239, Esophagogastroduodenoscopy, flexible,                            transoral; with biopsy, single or multiple Diagnosis Code(s):        --- Professional ---                           K25.9, Gastric ulcer, unspecified as acute or  chronic, without hemorrhage or perforation                           K29.71, Gastritis, unspecified, with bleeding                           K92.2, Gastrointestinal hemorrhage, unspecified                           K26.9, Duodenal ulcer, unspecified as acute or                            chronic, without hemorrhage or perforation                           K74.60, Unspecified cirrhosis of liver                           I85.10, Secondary esophageal varices without                            bleeding                           K92.1, Melena (includes Hematochezia) CPT copyright 2017 American [employer]. All rights reserved. The codes documented in this report are preliminary and upon coder review may  be revised to meet current compliance requirements. Kathi Der, MD Kathi Der, MD 08/08/2017 9:55:58 AM Number  of Addenda: 0

## 2017-08-08 NOTE — Transfer of Care (Signed)
Immediate Anesthesia Transfer of Care Note  Patient: George Bridges  Procedure(s) Performed: ESOPHAGOGASTRODUODENOSCOPY (EGD) WITH PROPOFOL (N/A ) BIOPSY ESOPHAGEAL BANDING  Patient Location: Endoscopy Unit  Anesthesia Type:MAC  Level of Consciousness: sedated  Airway & Oxygen Therapy: Patient Spontanous Breathing and Patient connected to nasal cannula oxygen  Post-op Assessment: Report given to RN and Post -op Vital signs reviewed and stable  Post vital signs: Reviewed and stable  Last Vitals:  Vitals Value Taken Time  BP    Temp    Pulse    Resp    SpO2      Last Pain:  Vitals:   08/08/17 0836  TempSrc: Oral  PainSc: 0-No pain         Complications: No apparent anesthesia complications

## 2017-08-09 ENCOUNTER — Inpatient Hospital Stay (HOSPITAL_COMMUNITY): Payer: Medicare Other

## 2017-08-09 ENCOUNTER — Encounter (HOSPITAL_COMMUNITY): Payer: Self-pay | Admitting: Gastroenterology

## 2017-08-09 LAB — GLUCOSE, CAPILLARY
GLUCOSE-CAPILLARY: 324 mg/dL — AB (ref 70–99)
GLUCOSE-CAPILLARY: 217 mg/dL — AB (ref 70–99)
Glucose-Capillary: 230 mg/dL — ABNORMAL HIGH (ref 70–99)
Glucose-Capillary: 178 mg/dL — ABNORMAL HIGH (ref 70–99)
Glucose-Capillary: 221 mg/dL — ABNORMAL HIGH (ref 70–99)

## 2017-08-09 LAB — CBC
HCT: 26 % — ABNORMAL LOW (ref 39.0–52.0)
Hemoglobin: 8.7 g/dL — ABNORMAL LOW (ref 13.0–17.0)
MCH: 32.5 pg (ref 26.0–34.0)
MCHC: 33.5 g/dL (ref 30.0–36.0)
MCV: 97 fL (ref 78.0–100.0)
PLATELETS: 83 10*3/uL — AB (ref 150–400)
RBC: 2.68 MIL/uL — ABNORMAL LOW (ref 4.22–5.81)
RDW: 14.9 % (ref 11.5–15.5)
WBC: 4.3 10*3/uL (ref 4.0–10.5)

## 2017-08-09 LAB — COMPREHENSIVE METABOLIC PANEL
ALBUMIN: 2.5 g/dL — AB (ref 3.5–5.0)
ALT: 65 U/L — ABNORMAL HIGH (ref 0–44)
AST: 91 U/L — ABNORMAL HIGH (ref 15–41)
Alkaline Phosphatase: 58 U/L (ref 38–126)
Anion gap: 4 — ABNORMAL LOW (ref 5–15)
BUN: 25 mg/dL — AB (ref 8–23)
CHLORIDE: 108 mmol/L (ref 98–111)
CO2: 22 mmol/L (ref 22–32)
Calcium: 7.4 mg/dL — ABNORMAL LOW (ref 8.9–10.3)
Creatinine, Ser: 1.01 mg/dL (ref 0.61–1.24)
GFR calc Af Amer: >60 mL/min (ref >60)
GFR calc non Af Amer: >60 mL/min (ref >60)
GLUCOSE: 223 mg/dL — AB (ref 70–99)
POTASSIUM: 4.3 mmol/L (ref 3.5–5.1)
Sodium: 134 mmol/L — ABNORMAL LOW (ref 135–145)
Total Bilirubin: 1.1 mg/dL (ref 0.3–1.2)
Total Protein: 5.5 g/dL — ABNORMAL LOW (ref 6.5–8.1)

## 2017-08-09 LAB — BODY FLUID CELL COUNT WITH DIFFERENTIAL
Eos, Fluid: 1 %
Lymphs, Fluid: 34 %
Monocyte-Macrophage-Serous Fluid: 62 % (ref 50–90)
Neutrophil Count, Fluid: 3 % (ref 0–25)
Total Nucleated Cell Count, Fluid: 89 uL (ref 0–1000)

## 2017-08-09 LAB — GRAM STAIN

## 2017-08-09 LAB — ANTI-SMOOTH MUSCLE ANTIBODY, IGG: F-Actin IgG: 6 Units (ref 0–19)

## 2017-08-09 LAB — HEPATITIS C ANTIBODY

## 2017-08-09 LAB — HEPATITIS B SURFACE ANTIGEN: HEP B S AG: NEGATIVE

## 2017-08-09 LAB — ALBUMIN, PLEURAL OR PERITONEAL FLUID: Albumin, Fluid: <1 g/dL

## 2017-08-09 LAB — PROTEIN, PLEURAL OR PERITONEAL FLUID: Total protein, fluid: <3 g/dL

## 2017-08-09 LAB — ANTINUCLEAR ANTIBODIES, IFA: ANA Ab, IFA: NEGATIVE

## 2017-08-09 LAB — MITOCHONDRIAL ANTIBODIES: Mitochondrial M2 Ab, IgG: 26.8 Units — ABNORMAL HIGH (ref 0.0–20.0)

## 2017-08-09 MED ORDER — LIDOCAINE HCL 1 % IJ SOLN
INTRAMUSCULAR | Status: AC
  Filled 2017-08-09: qty 20

## 2017-08-09 MED ORDER — INSULIN ASPART 100 UNIT/ML ~~LOC~~ SOLN
0.0000 [IU] | Freq: Three times a day (TID) | SUBCUTANEOUS | Status: DC
  Administered 2017-08-09: 11 [IU] via SUBCUTANEOUS
  Administered 2017-08-10: 8 [IU] via SUBCUTANEOUS
  Administered 2017-08-10: 3 [IU] via SUBCUTANEOUS
  Administered 2017-08-10 – 2017-08-11 (×2): 8 [IU] via SUBCUTANEOUS
  Administered 2017-08-11: 3 [IU] via SUBCUTANEOUS

## 2017-08-09 MED ORDER — INSULIN ASPART 100 UNIT/ML ~~LOC~~ SOLN
0.0000 [IU] | Freq: Every day | SUBCUTANEOUS | Status: DC
  Administered 2017-08-09 – 2017-08-10 (×2): 2 [IU] via SUBCUTANEOUS

## 2017-08-09 NOTE — Progress Notes (Signed)
Inpatient Diabetes Program Recommendations  AACE/ADA: New Consensus Statement on Inpatient Glycemic Control (2015)  Target Ranges:  Prepandial:   less than 140 mg/dL      Peak postprandial:   less than 180 mg/dL (1-2 hours)      Critically ill patients:  140 - 180 mg/dL   Lab Results  Component Value Date   GLUCAP 221 (H) 08/09/2017   HGBA1C 11.4 (H) 08/07/2017    Review of Glycemic Control  Diabetes history: DM2 Outpatient Diabetes medications: Amaryl 2 mg QD, metformin 500 mg QD Current orders for Inpatient glycemic control: Novolog 0-15 units tidwc and hs  HgbA1C - 11.4% - uncontrolled.  Inpatient Diabetes Program Recommendations:     Add Novolog 4 units tidwc for meal coverage insulin if pt eats > 50% meal May need small amount of basal insulin.  Spoke with pt about HgbA1C of 11.4% and importance of controlling blood sugars to prevent long and short term complications. Pt states "I don't do numbers well, so I don't keep up with A1C." Will f/u with pt regarding importance of diabetes control in am.   Thank you. Ailene Ardshonda Camillo Quadros, RD, LDN, CDE Inpatient Diabetes Coordinator [phone]

## 2017-08-09 NOTE — Progress Notes (Signed)
PROGRESS NOTE    George HaywardRichard Pugsley  ZOX:[id number] DOB: 03/19/1955 DOA: 08/07/2017 PCP: Maudie FlakesAnderson, Shane D, FNP    Brief Narrative:  62 y.o. male with medical history significant of DM, depression who presents after calling EMS for emotional distress.  Patient states that his fiance had broken off the engagement.  When EMS arrived at scene, patient's glucose was noted to be 11.  He was given 25 g of D10 and 15 g of Insta-Glucose.  Repeat blood sugar was 491.  Patient denies any thoughts of harming himself or suicidal ideations.  He states that he does take glimepiride daily, but denies that he took more than prescribed. He also states that for the past 3 days, he has noticed constant, loose, black stools  Gi on board for Melena, cirrhosis  Assessment & Plan:   Principal Problem:   GI bleed - Management per GI. Pt is s/p EGD Per GI: - Follow  HBsAG,HCV AB, AMA, ASMA, Alpha 1 antitrypsin, ceruloplasmin level. - paracentesis with fluid analysis after EGD. - consider starting antibiotics after paracentesis. - GI will follow.  Orders are in place for U/S guided paracentesis  Plan per GI:  - Clear liquid diet today - Continue octreotide - Change Protonix to IV twice a day - Repeat CBC in the morning - Ultrasound paracentesis with fluid analysis. - GI will follow  No fever or elevated wbc level will defer antibiotic initiation to GI.  Active Problems:   Other cirrhosis of liver (HCC) - GI following    DM type 2 (diabetes mellitus, type 2) (HCC) - continue SSI - Diet per GI    HTN (hypertension) - stable off antihypertensives  DVT prophylaxis: SCD's Code Status: Full Family Communication: none at bedside. Disposition Plan: continue to monitor closely in stepdown unit.   Consultants:   GI: Dr. Marca AnconaKarki   Procedures:   Findings ----------- - EGD showed large esophageal varices. 3 bands placed. It also showed Diffuse hemorrhagic gastritis,superficial/linear gastric and  duodenal bulb ulcers. Biopsies taken for H. Pylori.   Antimicrobials: None  Subjective:  No new problems reported by patient.  Objective: Vitals:   08/09/17 0406 08/09/17 0741 08/09/17 0749 08/09/17 0800  BP: 111/61 (!) 109/57  113/62  Pulse: 70 73  68  Resp: 18 15  17   Temp:   98.6 F (37 C)   TempSrc:   Oral   SpO2: 98% 95%  95%  Weight:      Height:        Intake/Output Summary (Last 24 hours) at 08/09/2017 0905 Last data filed at 08/09/2017 0800 Gross per 24 hour  Intake 3457.93 ml  Output 1000 ml  Net 2457.93 ml   Filed Weights   08/07/17 0255 08/07/17 0844 08/08/17 0836  Weight: 89.8 kg (198 lb) 95.9 kg (211 lb 6.7 oz) 95.7 kg (211 lb)    Examination: Exam unchanged when compared to prior 08/08/17  General exam: Appears calm and comfortable, in nad. Respiratory system: Clear to auscultation. Respiratory effort normal. Equal chest rise. Cardiovascular system: S1 & S2 heard, RRR. No JVD, murmurs, rubs Gastrointestinal system: Abdomen is distended, soft and nontender. Hypoactive bowel sounds. Central nervous system: Alert and oriented. No focal neurological deficits. Extremities: Symmetric 5 x 5 power. Skin: No rashes, lesions or ulcers, on limited exam Psychiatry: Judgement and insight appear normal. Mood & affect appropriate.     Data Reviewed: I have personally reviewed following labs and imaging studies  CBC: Recent Labs  Lab 08/07/17 0403 08/07/17 1432  08/08/17 0337 08/09/17 0334  WBC 8.0  --  5.0 4.3  NEUTROABS 4.6  --   --   --   HGB 8.7* 9.1* 8.8* 8.7*  HCT 25.5* 26.1* 25.8* 26.0*  MCV 95.5  --  94.9 97.0  PLT 97*  --  79* 83*   Basic Metabolic Panel: Recent Labs  Lab 08/07/17 0403 08/08/17 0337 08/09/17 0334  NA 131* 134* 134*  K 3.5 4.5 4.3  CL 96* 104 108  CO2 23 24 22   GLUCOSE 432* 235* 223*  BUN 52* 40* 25*  CREATININE 1.20 1.16 1.01  CALCIUM 8.2* 7.7* 7.4*   GFR: Estimated Creatinine Clearance: 83.2 mL/min (by C-G formula  based on SCr of 1.01 mg/dL). Liver Function Tests: Recent Labs  Lab 08/07/17 0403 08/08/17 0337 08/09/17 0334  AST 50* 98* 91*  ALT 45 64* 65*  ALKPHOS 57 52 58  BILITOT 1.4* 1.7* 1.1  PROT 6.0* 5.6* 5.5*  ALBUMIN 2.8* 2.6* 2.5*   No results for input(s): LIPASE, AMYLASE in the last 168 hours. No results for input(s): AMMONIA in the last 168 hours. Coagulation Profile: Recent Labs  Lab 08/07/17 1432  INR 1.41   Cardiac Enzymes: No results for input(s): CKTOTAL, CKMB, CKMBINDEX, TROPONINI in the last 168 hours. BNP (last 3 results) No results for input(s): PROBNP in the last 8760 hours. HbA1C: Recent Labs    08/07/17 0921  HGBA1C 11.4*   CBG: Recent Labs  Lab 08/08/17 1648 08/08/17 1939 08/08/17 2325 08/09/17 0253 08/09/17 0739  GLUCAP 230* 258* 222* 230* 178*   Lipid Profile: No results for input(s): CHOL, HDL, LDLCALC, TRIG, CHOLHDL, LDLDIRECT in the last 72 hours. Thyroid Function Tests: No results for input(s): TSH, T4TOTAL, FREET4, T3FREE, THYROIDAB in the last 72 hours. Anemia Panel: No results for input(s): VITAMINB12, FOLATE, FERRITIN, TIBC, IRON, RETICCTPCT in the last 72 hours. Sepsis Labs: No results for input(s): PROCALCITON, LATICACIDVEN in the last 168 hours.  Recent Results (from the past 240 hour(s))  MRSA PCR Screening     Status: None   Collection Time: 08/07/17  8:58 AM  Result Value Ref Range Status   MRSA by PCR NEGATIVE NEGATIVE Final    Comment:        The GeneXpert MRSA Assay (FDA approved for NASAL specimens only), is one component of a comprehensive MRSA colonization surveillance program. It is not intended to diagnose MRSA infection nor to guide or monitor treatment for MRSA infections. Performed at [facility], [address], [place], [place] [address]      Radiology Studies: No results found.  Scheduled Meds: . insulin aspart  0-9 Units Subcutaneous Q4H  . pantoprazole  40 mg Intravenous Q12H     Continuous Infusions: . sodium chloride 75 mL/hr at 08/08/17 2258  . octreotide  (SANDOSTATIN)    IV infusion 50 mcg/hr (08/09/17 0414)     LOS: 2 days    Time spent: 25 min  Penny Pia, MD [facility] Pager [phone]  If 7PM-7AM, please contact night-coverage [email or web address] Password [email or web address] 08/09/2017, 9:05 AM

## 2017-08-09 NOTE — Procedures (Signed)
Ultrasound-guided diagnostic and therapeutic paracentesis performed yielding 3.6 liters of light yellow fluid. No immediate complications.  A portion of the fluid was submitted to the lab for preordered studies.

## 2017-08-09 NOTE — Progress Notes (Signed)
Nutrition Brief Note  Patient identified on the Malnutrition Screening Tool (MST) Report  Wt Readings from Last 15 Encounters:  08/08/17 211 lb (95.7 kg)  04/23/12 221 lb 4.8 oz (100.4 kg)    Body mass index is 34.06 kg/m. Patient meets criteria for obesity based on current BMI. Skin WDL. Patient with hx of DM, depression. He called EMS d/t emotional distress and was found to have CBG of 11 mg/dL. Patient dx with GIB on admission and is s/p EGD. Patient reports abdominal distention and associated feelings of pressure. He reports doing well with CLD and that he wears his partial when eating (which he did not bring to the hospital).   Patient reports he usually has a very good appetite and eats more than 1 helping of each item at meals but that for the past few days he has been experiencing early satiety and fullness after a few bites. He states this is very unusual for him. He also reports several stressors in his personal life around this time which have been exacerbating factors.   Limited weight hx available. Per Care Everywhere, pt weighed 210 lbs on 01/18/17. This is consistent with current weight.   Current diet order is CLD, patient consumed 75% of lunch and 100% of dinner yesterday. Ordered lunch for today shortly before RD visit. Medications reviewed; sliding scale Novolog, 40 mg IV Protonix BID.  Labs reviewed; CBGs: 230 and 178 mg/dL today, Na: 562134 mmol/L, BUN: 25 mg/dL, Ca: 7.4 mg/dL, LFTs elevated.   IVF: NS @ 75 mL/hr.   No nutrition interventions warranted at this time. If nutrition issues arise, please consult RD.     Trenton GammonJessica Birdie Beveridge, MS, RD, LDN, [facility] Inpatient Clinical Dietitian Pager # [phone] After hours/weekend pager # [phone]

## 2017-08-09 NOTE — Progress Notes (Signed)
[facility]  George HaywardRichard Bridges 62 y.o. 09/16/1955  CC:  Melena, cirrhosis   Subjective: no acute overnight issues. Denied any black tarry stool since admission. Complaining of nausea, denied vomiting.  Complaining of abdominal pressure sensation.complaining of abdominal pressure sensation.  ROS: no Chest pain.  Mild symptoms of breath    Objective: Vital signs in last 24 hours: Vitals:   08/09/17 0900 08/09/17 1000  BP: 114/62 99/60  Pulse: 80 73  Resp: 20 17  Temp:    SpO2: 95% 95%    Physical Exam:  Gen. Alert/oriented 3. Not in acute distress Abdomen. Distended, bowel sounds present, no peritoneal signs. Lower extremity.trace edema.  Psych : mood and affect normal.  Lab Results: Recent Labs    08/08/17 0337 08/09/17 0334  NA 134* 134*  K 4.5 4.3  CL 104 108  CO2 24 22  GLUCOSE 235* 223*  BUN 40* 25*  CREATININE 1.16 1.01  CALCIUM 7.7* 7.4*   Recent Labs    08/08/17 0337 08/09/17 0334  AST 98* 91*  ALT 64* 65*  ALKPHOS 52 58  BILITOT 1.7* 1.1  PROT 5.6* 5.5*  ALBUMIN 2.6* 2.5*   Recent Labs    08/07/17 0403  08/08/17 0337 08/09/17 0334  WBC 8.0  --  5.0 4.3  NEUTROABS 4.6  --   --   --   HGB 8.7*   < > 8.8* 8.7*  HCT 25.5*   < > 25.8* 26.0*  MCV 95.5  --  94.9 97.0  PLT 97*  --  79* 83*   < > = values in this interval not displayed.   Recent Labs    08/07/17 1432  LABPROT 17.1*  INR 1.41      Assessment/Plan: - melena in setting of CT scan showing cirrhosis . EGD 06/24  large esophageal varices. 3 bands placed. It also showed Diffuse hemorrhagic gastritis,superficial/linear gastric and duodenal bulb ulcers. - acute blood loss anemia. Hemoglobin stable - newly diagnosed cirrhosis. MELD score 17 as of 06/24 .  Denies alcohol use although AST > ALT , concerning for underlying alcohol use.  -Ascites.ascites.  Recommendations ------------------------- - paracentesis with fluid analysis today. - hemoglobin stable.  LFTs trending down. -  HBsAG and HCV AB negative. , AMA and ASMA pending  - Normal Alpha 1 antitrypsin, Normal ceruloplasmin level. - recommend repeat EGD in 3-4 weeks for further band ligation. - start antibiotics after paracentesis. Continue PPI. - GI will follow  Kathi DerParag Nazariah Cadet MD, FACP 08/09/2017, 10:45 AM  Contact #  [phone]

## 2017-08-10 DIAGNOSIS — K921 Melena: Secondary | ICD-10-CM

## 2017-08-10 DIAGNOSIS — K7460 Unspecified cirrhosis of liver: Secondary | ICD-10-CM

## 2017-08-10 DIAGNOSIS — D500 Iron deficiency anemia secondary to blood loss (chronic): Secondary | ICD-10-CM

## 2017-08-10 LAB — CBC
HEMATOCRIT: 25.8 % — AB (ref 39.0–52.0)
HEMOGLOBIN: 8.5 g/dL — AB (ref 13.0–17.0)
MCH: 31.7 pg (ref 26.0–34.0)
MCHC: 32.9 g/dL (ref 30.0–36.0)
MCV: 96.3 fL (ref 78.0–100.0)
Platelets: 70 10*3/uL — ABNORMAL LOW (ref 150–400)
RBC: 2.68 MIL/uL — AB (ref 4.22–5.81)
RDW: 14.5 % (ref 11.5–15.5)
WBC: 3.6 10*3/uL — AB (ref 4.0–10.5)

## 2017-08-10 LAB — GLUCOSE, CAPILLARY
GLUCOSE-CAPILLARY: 192 mg/dL — AB (ref 70–99)
Glucose-Capillary: 260 mg/dL — ABNORMAL HIGH (ref 70–99)
Glucose-Capillary: 289 mg/dL — ABNORMAL HIGH (ref 70–99)
Glucose-Capillary: 230 mg/dL — ABNORMAL HIGH (ref 70–99)

## 2017-08-10 MED ORDER — PANTOPRAZOLE SODIUM 40 MG PO TBEC
40.0000 mg | DELAYED_RELEASE_TABLET | Freq: Two times a day (BID) | ORAL | Status: DC
  Administered 2017-08-10 – 2017-08-13 (×6): 40 mg via ORAL
  Filled 2017-08-10 (×6): qty 1

## 2017-08-10 MED ORDER — SODIUM CHLORIDE 0.9 % IV SOLN
2.0000 g | INTRAVENOUS | Status: DC
  Administered 2017-08-10 – 2017-08-12 (×3): 2 g via INTRAVENOUS
  Filled 2017-08-10: qty 20
  Filled 2017-08-10 (×2): qty 2
  Filled 2017-08-10: qty 20

## 2017-08-10 MED ORDER — SPIRONOLACTONE 25 MG PO TABS
25.0000 mg | ORAL_TABLET | Freq: Every day | ORAL | Status: DC
  Administered 2017-08-10 – 2017-08-13 (×4): 25 mg via ORAL
  Filled 2017-08-10 (×4): qty 1

## 2017-08-10 MED ORDER — FUROSEMIDE 20 MG PO TABS
20.0000 mg | ORAL_TABLET | Freq: Every day | ORAL | Status: DC
  Administered 2017-08-10 – 2017-08-13 (×4): 20 mg via ORAL
  Filled 2017-08-10 (×4): qty 1

## 2017-08-10 NOTE — Plan of Care (Signed)
Pt alert and oriented, resting with no complaints.  Plan of care discussed with pt.  RN will monitor.  

## 2017-08-10 NOTE — Care Management Important Message (Signed)
Important Message  Patient Details  Name: George Bridges MRN: [id number] Date of Birth: 07/15/1955   Medicare Important Message Given:  Yes    Caren MacadamFuller, Rheta Hemmelgarn 08/10/2017, 10:07 AMImportant Message  Patient Details  Name: George Bridges MRN: [id number] Date of Birth: 03/05/1955   Medicare Important Message Given:  Yes    Caren MacadamFuller, Lem Peary 08/10/2017, 10:06 AM

## 2017-08-10 NOTE — Progress Notes (Signed)
[facility]  George HaywardRichard Bridges 62 y.o. 10/12/1955  CC:  Melena, cirrhosis   Subjective:  Doing better after paracentesis. Abdominal distention improved. Denies nausea vomiting. Denied further bleeding episodes.  ROS: no Chest pain.  Denies shortness of breath.   Objective: Vital signs in last 24 hours: Vitals:   08/10/17 0614 08/10/17 1423  BP: 110/68 (!) 105/53  Pulse: 67 71  Resp: 17 16  Temp: 98.3 F (36.8 C) 97.9 F (36.6 C)  SpO2: 97%     Physical Exam:  Gen. Alert/oriented 3. Not in acute distress Abdomen. Abdomen remains mildly distended,  bowel sounds present, no peritoneal signs. Lower extremity.trace edema.  Psych : mood and affect normal.  Lab Results: Recent Labs    08/08/17 0337 08/09/17 0334  NA 134* 134*  K 4.5 4.3  CL 104 108  CO2 24 22  GLUCOSE 235* 223*  BUN 40* 25*  CREATININE 1.16 1.01  CALCIUM 7.7* 7.4*   Recent Labs    08/08/17 0337 08/09/17 0334  AST 98* 91*  ALT 64* 65*  ALKPHOS 52 58  BILITOT 1.7* 1.1  PROT 5.6* 5.5*  ALBUMIN 2.6* 2.5*   Recent Labs    08/09/17 0334 08/10/17 0423  WBC 4.3 3.6*  HGB 8.7* 8.5*  HCT 26.0* 25.8*  MCV 97.0 96.3  PLT 83* 70*   No results for input(s): LABPROT, INR in the last 72 hours.    Assessment/Plan: - melena in setting of CT scan showing cirrhosis . EGD 06/24  large esophageal varices. 3 bands placed. It also showed Diffuse hemorrhagic gastritis,superficial/linear gastric and duodenal bulb ulcers.biopsies negative for H. Pylori and intestinal metaplasia. - acute blood loss anemia. Hemoglobin stable - newly diagnosed cirrhosis. MELD score 17 as of 06/24 .  Denies alcohol use although AST > ALT , concerning for underlying alcohol use. Mildly positive antimitochondrial antibody at 26.8.his alkaline phosphatase is normal. -Ascites.status post paracentesis yesterday 06/25 with removal of 3.6 L of fluids.Albumin less than 1. Negative for SBP. Cytology also  negative.  Recommendations ------------------------- - start Lasix 20 mg and Aldactone 25 mg. - consider adding beta blocker tomorrow for secondary prevention of esophageal varices if  blood pressure and heart rate remained stable. - AMA mildly positive at 26.8, recommend repeat AMA  as an outpatient in 4 weeks. -  HBsAG and HCV AB negative.ASMA  Negative.  Normal Alpha 1 antitrypsin, Normal ceruloplasmin level. - recommend repeat EGD in 3-4 weeks for further band ligation. -  Change Protonix to oral. Continue twice a day PPI for 6 weeks followed by once a day PPI. - Start soft diet. 2 g sodium diet. - hopefully discharge home tomorrow   Kathi DerParag George Haggart MD, FACP 08/10/2017, 3:21 PM  Contact #  [phone]

## 2017-08-10 NOTE — Progress Notes (Signed)
PROGRESS NOTE  George Bridges [id number]:[id number] DOB: 08/31/1955 DOA: 08/07/2017 PCP: George Flakes, FNP  HPI/Recap of past 24 hours: George Bridges is a 62 year old male with a past medical history significant for type 2 diabetes on glimepiride, depression, denies current or previous use of alcohol who presented to the ED due to emotional distress.  Found to be hypoglycemic which was corrected in the ED. Self reported melena with positive FOBT.  GI consulted.  EGD done on 08/08/2017 revealed large esophageal varices with 3 bands placed, diffuse hemorrhagic gastritis.  Superficial/linear gastric and duodenal bulb ulcers.  Newly diagnosed cirrhosis.  08/10/2017: Patient seen and examined at his bedside.  He has no new complaints.  Assessment/Plan: Principal Problem:   GI bleed Active Problems:   Other cirrhosis of liver (HCC)   DM type 2 (diabetes mellitus, type 2) (HCC)   HTN (hypertension)  Newly diagnosed cirrhosis with unclear etiology EGD done on 08/08/2017 revealed large esophageal varices, diffuse hemorrhagic gastritis, superficial/linear gastric and duodenal bulb ulcers. Denies current or previous alcohol use Acute hepatitis viral panel negative Normal alpha-1 antitrypsin Normal ceruloplasmin level Continue octreotide drip Started IV ceftriaxone Continue PPI twice daily Continue p.o. Spironolactone Continue Lasix p.o. 20 mg daily Continue IV fluids to maintain map greater than 65 Post paracentesis done on 08/09/2017 Obtain CBC in the morning  Upper GI bleed Hemoglobin is stable Repeat CBC in the morning No bowel movement reported today EGD recommended in 3 to 4 weeks for further banding and ligation  Acute blood loss anemia Management as stated above  Severe protein calorie deficiency Albumin 2.5 Most likely secondary to advanced liver  Type 2 diabetes Obtain A1c Hold glimepiride and metformin Continue insulin sliding scale  Chronic depression Continue  fluoxetine    Code Status: Full code  Family Communication: None at bedside  Disposition Plan: Home when clinically stable   Consultants:  GI  Procedures:  EGD  Antimicrobials:  IV ceftriaxone  DVT prophylaxis: SCDs   Objective: Vitals:   08/09/17 1812 08/09/17 2203 08/10/17 0614 08/10/17 1423  BP: 116/70 103/68 110/68 (!) 105/53  Pulse: 78 69 67 71  Resp: 17 18 17 16   Temp: 98.4 F (36.9 C) 98 F (36.7 C) 98.3 F (36.8 C) 97.9 F (36.6 C)  TempSrc: Oral Oral  Oral  SpO2: 98% 96% 97%   Weight:      Height:        Intake/Output Summary (Last 24 hours) at 08/10/2017 1518 Last data filed at 08/10/2017 1237 Gross per 24 hour  Intake 3726.25 ml  Output 1725 ml  Net 2001.25 ml   Filed Weights   08/07/17 0255 08/07/17 0844 08/08/17 0836  Weight: 89.8 kg (198 lb) 95.9 kg (211 lb 6.7 oz) 95.7 kg (211 lb)    Exam:  . General: 62 y.o. year-old male well developed well nourished in no acute distress.  Alert and oriented x3. . Cardiovascular: Regular rate and rhythm with no rubs or gallops.  No thyromegaly or JVD noted.   Marland Kitchen Respiratory: Clear to auscultation with no wheezes or rales. Good inspiratory effort. . Abdomen: Distended nontender with hypoactive bowel sounds x4 quadrants. . Musculoskeletal: No lower extremity edema. 2/4 pulses in all 4 extremities. . Skin: No ulcerative lesions noted or rashes . Psychiatry: Mood is appropriate for condition and setting   Data Reviewed: CBC: Recent Labs  Lab 08/07/17 0403 08/07/17 1432 08/08/17 0337 08/09/17 0334 08/10/17 0423  WBC 8.0  --  5.0 4.3 3.6*  NEUTROABS 4.6  --   --   --   --  HGB 8.7* 9.1* 8.8* 8.7* 8.5*  HCT 25.5* 26.1* 25.8* 26.0* 25.8*  MCV 95.5  --  94.9 97.0 96.3  PLT 97*  --  79* 83* 70*   Basic Metabolic Panel: Recent Labs  Lab 08/07/17 0403 08/08/17 0337 08/09/17 0334  NA 131* 134* 134*  K 3.5 4.5 4.3  CL 96* 104 108  CO2 23 24 22   GLUCOSE 432* 235* 223*  BUN 52* 40* 25*   CREATININE 1.20 1.16 1.01  CALCIUM 8.2* 7.7* 7.4*   GFR: Estimated Creatinine Clearance: 83.2 mL/min (by C-G formula based on SCr of 1.01 mg/dL). Liver Function Tests: Recent Labs  Lab 08/07/17 0403 08/08/17 0337 08/09/17 0334  AST 50* 98* 91*  ALT 45 64* 65*  ALKPHOS 57 52 58  BILITOT 1.4* 1.7* 1.1  PROT 6.0* 5.6* 5.5*  ALBUMIN 2.8* 2.6* 2.5*   No results for input(s): LIPASE, AMYLASE in the last 168 hours. No results for input(s): AMMONIA in the last 168 hours. Coagulation Profile: Recent Labs  Lab 08/07/17 1432  INR 1.41   Cardiac Enzymes: No results for input(s): CKTOTAL, CKMB, CKMBINDEX, TROPONINI in the last 168 hours. BNP (last 3 results) No results for input(s): PROBNP in the last 8760 hours. HbA1C: No results for input(s): HGBA1C in the last 72 hours. CBG: Recent Labs  Lab 08/09/17 1146 08/09/17 1608 08/09/17 2221 08/10/17 0707 08/10/17 1142  GLUCAP 221* 324* 217* 192* 260*   Lipid Profile: No results for input(s): CHOL, HDL, LDLCALC, TRIG, CHOLHDL, LDLDIRECT in the last 72 hours. Thyroid Function Tests: No results for input(s): TSH, T4TOTAL, FREET4, T3FREE, THYROIDAB in the last 72 hours. Anemia Panel: No results for input(s): VITAMINB12, FOLATE, FERRITIN, TIBC, IRON, RETICCTPCT in the last 72 hours. Urine analysis:    Component Value Date/Time   COLORURINE YELLOW 06/22/2012 1544   APPEARANCEUR CLEAR 06/22/2012 1544   LABSPEC 1.040 (H) 06/22/2012 1544   PHURINE 6.0 06/22/2012 1544   GLUCOSEU >1000 (A) 06/22/2012 1544   HGBUR NEGATIVE 06/22/2012 1544   BILIRUBINUR NEGATIVE 06/22/2012 1544   KETONESUR NEGATIVE 06/22/2012 1544   PROTEINUR NEGATIVE 06/22/2012 1544   UROBILINOGEN 1.0 06/22/2012 1544   NITRITE NEGATIVE 06/22/2012 1544   LEUKOCYTESUR NEGATIVE 06/22/2012 1544   Sepsis Labs: @LABRCNTIP (procalcitonin:4,lacticidven:4)  ) Recent Results (from the past 240 hour(s))  MRSA PCR Screening     Status: None   Collection Time: 08/07/17   8:58 AM  Result Value Ref Range Status   MRSA by PCR NEGATIVE NEGATIVE Final    Comment:        The GeneXpert MRSA Assay (FDA approved for NASAL specimens only), is one component of a comprehensive MRSA colonization surveillance program. It is not intended to diagnose MRSA infection nor to guide or monitor treatment for MRSA infections. Performed at [facility], [address] [place], [place] [address]   Culture, body fluid-bottle     Status: None (Preliminary result)   Collection Time: 08/09/17  2:30 PM  Result Value Ref Range Status   Specimen Description FLUID PERITONEAL  Final   Special Requests BOTTLES DRAWN AEROBIC AND ANAEROBIC  Final   Culture   Final    NO GROWTH < 24 HOURS Performed at [facility], [address] [place], [place] [address]    Report Status PENDING  Incomplete  Gram stain     Status: None   Collection Time: 08/09/17  2:30 PM  Result Value Ref Range Status   Specimen Description FLUID PERITONEAL  Final  Special Requests NONE  Final   Gram Stain   Final    WBC PRESENT, PREDOMINANTLY MONONUCLEAR NO ORGANISMS SEEN CYTOSPIN SMEAR Performed at [facility], [address] [place], [place] [address]    Report Status 08/09/2017 FINAL  Final      Studies: No results found.  Scheduled Meds: . insulin aspart  0-15 Units Subcutaneous TID WC  . insulin aspart  0-5 Units Subcutaneous QHS  . pantoprazole  40 mg Intravenous Q12H    Continuous Infusions: . sodium chloride 75 mL/hr at 08/10/17 1237  . cefTRIAXone (ROCEPHIN)  IV Stopped (08/10/17 6045)  . octreotide  (SANDOSTATIN)    IV infusion 50 mcg/hr (08/10/17 1052)     LOS: 3 days     Darlin Drop, MD [facility] Pager [phone]  If 7PM-7AM, please contact night-coverage [email or web address] Password [facility] 08/10/2017, 3:18 PM

## 2017-08-11 ENCOUNTER — Encounter (HOSPITAL_COMMUNITY): Payer: Self-pay

## 2017-08-11 LAB — TYPE AND SCREEN
ABO/RH(D): A POS
Antibody Screen: NEGATIVE
UNIT DIVISION: 0
Unit division: 0

## 2017-08-11 LAB — COMPREHENSIVE METABOLIC PANEL
ALK PHOS: 54 U/L (ref 38–126)
ALT: 50 U/L — ABNORMAL HIGH (ref 0–44)
ANION GAP: 6 (ref 5–15)
AST: 44 U/L — ABNORMAL HIGH (ref 15–41)
Albumin: 2.3 g/dL — ABNORMAL LOW (ref 3.5–5.0)
BUN: 11 mg/dL (ref 8–23)
CALCIUM: 7.4 mg/dL — AB (ref 8.9–10.3)
CO2: 20 mmol/L — ABNORMAL LOW (ref 22–32)
Chloride: 108 mmol/L (ref 98–111)
Creatinine, Ser: 0.86 mg/dL (ref 0.61–1.24)
GFR calc Af Amer: >60 mL/min (ref >60)
GFR calc non Af Amer: >60 mL/min (ref >60)
Glucose, Bld: 175 mg/dL — ABNORMAL HIGH (ref 70–99)
Potassium: 4 mmol/L (ref 3.5–5.1)
Sodium: 134 mmol/L — ABNORMAL LOW (ref 135–145)
TOTAL PROTEIN: 5.3 g/dL — AB (ref 6.5–8.1)
Total Bilirubin: 0.9 mg/dL (ref 0.3–1.2)

## 2017-08-11 LAB — GLUCOSE, CAPILLARY
GLUCOSE-CAPILLARY: 164 mg/dL — AB (ref 70–99)
GLUCOSE-CAPILLARY: 239 mg/dL — AB (ref 70–99)
GLUCOSE-CAPILLARY: 204 mg/dL — AB (ref 70–99)
Glucose-Capillary: 288 mg/dL — ABNORMAL HIGH (ref 70–99)

## 2017-08-11 LAB — CBC
HCT: 26.6 % — ABNORMAL LOW (ref 39.0–52.0)
Hemoglobin: 9 g/dL — ABNORMAL LOW (ref 13.0–17.0)
MCH: 33.1 pg (ref 26.0–34.0)
MCHC: 33.8 g/dL (ref 30.0–36.0)
MCV: 97.8 fL (ref 78.0–100.0)
PLATELETS: 70 10*3/uL — AB (ref 150–400)
RBC: 2.72 MIL/uL — AB (ref 4.22–5.81)
RDW: 14.6 % (ref 11.5–15.5)
WBC: 3.7 10*3/uL — ABNORMAL LOW (ref 4.0–10.5)

## 2017-08-11 LAB — BPAM RBC
BLOOD PRODUCT EXPIRATION DATE: 201907132359
Blood Product Expiration Date: 201907132359
ISSUE DATE / TIME: 201906230903
UNIT TYPE AND RH: 6200
UNIT TYPE AND RH: 6200

## 2017-08-11 MED ORDER — INSULIN ASPART 100 UNIT/ML ~~LOC~~ SOLN: 8.0000 [IU] | Freq: Three times a day (TID) | SUBCUTANEOUS | Status: DC

## 2017-08-11 MED ORDER — PANTOPRAZOLE SODIUM 40 MG PO TBEC: 40.0000 mg | DELAYED_RELEASE_TABLET | Freq: Two times a day (BID) | ORAL | 0 refills | Status: AC

## 2017-08-11 MED ORDER — INSULIN ASPART 100 UNIT/ML ~~LOC~~ SOLN
0.0000 [IU] | Freq: Three times a day (TID) | SUBCUTANEOUS | Status: DC
  Administered 2017-08-11: 5 [IU] via SUBCUTANEOUS
  Administered 2017-08-12 (×2): 3 [IU] via SUBCUTANEOUS
  Administered 2017-08-12: 8 [IU] via SUBCUTANEOUS

## 2017-08-11 MED ORDER — INSULIN ASPART 100 UNIT/ML ~~LOC~~ SOLN
0.0000 [IU] | Freq: Every day | SUBCUTANEOUS | Status: DC
  Administered 2017-08-11: 2 [IU] via SUBCUTANEOUS

## 2017-08-11 MED ORDER — INSULIN ASPART 100 UNIT/ML ~~LOC~~ SOLN: 0.0000 [IU] | Freq: Three times a day (TID) | SUBCUTANEOUS | 0 refills | Status: DC

## 2017-08-11 MED ORDER — FUROSEMIDE 20 MG PO TABS: 20.0000 mg | ORAL_TABLET | Freq: Every day | ORAL | 0 refills | Status: DC

## 2017-08-11 MED ORDER — INSULIN ASPART 100 UNIT/ML ~~LOC~~ SOLN: 0.0000 [IU] | Freq: Every day | SUBCUTANEOUS | 0 refills | Status: DC

## 2017-08-11 MED ORDER — INSULIN GLARGINE 100 UNIT/ML ~~LOC~~ SOLN
10.0000 [IU] | SUBCUTANEOUS | Status: DC
  Administered 2017-08-11: 10 [IU] via SUBCUTANEOUS
  Filled 2017-08-11: qty 0.1

## 2017-08-11 MED ORDER — SPIRONOLACTONE 25 MG PO TABS: 25.0000 mg | ORAL_TABLET | Freq: Every day | ORAL | 0 refills | Status: DC

## 2017-08-11 NOTE — Discharge Summary (Addendum)
Discharge Summary  George Bridges ZOX:[id number] DOB: 05/08/1955  PCP: Maudie Flakes, FNP  Admit date: 08/07/2017 Discharge date: 08/11/2017  Time spent: 25 minutes  Discharge delayed to educate the patient on insulin and diabetes management. Diabetes coordinator consulted to assist.  Highly appreciated.  Recommendations for Outpatient Follow-up:  1. Follow-up with GI 2. Follow-up with PCP 3. Take your medications as prescribed 4. Start 2 g sodium diet  Recommendations from GI: ------------------------- - Continue Lasix 20 mg and Aldactone 25 mg.D/C octreotide.Continue twice a day PPI for 6 weeks followed by once a day PPI. -   2 g sodium diet. - patient declined use of beta blocker at this time because he has  ongoing weakness and fatigue which we'll get worse with starting beta blocker. - AMA mildly positive at 26.8, recommend repeat AMA  as an outpatient in 4 weeks. -  HBsAG and HCV AB negative.ASMA  Negative.  Normal Alpha 1 antitrypsin, Normal ceruloplasmin level. - recommend repeat EGD in 3-4 weeks for further band ligation. - okay to discharge home from GI standpoint. GI will sign off. Call us back if needed.   Discharge Diagnoses:  Active Hospital Problems   Diagnosis Date Noted  . GI bleed 08/07/2017  . Other cirrhosis of liver (HCC) 08/07/2017  . DM type 2 (diabetes mellitus, type 2) (HCC) 08/07/2017  . HTN (hypertension) 08/07/2017    Resolved Hospital Problems  No resolved problems to display.    Discharge Condition: Stable  Diet recommendation: 2 g sodium diet Vitals:   08/11/17 0604 08/11/17 1458  BP: 114/72 125/64  Pulse: 63 72  Resp: 17 16  Temp: 97.9 F (36.6 C) 98.6 F (37 C)  SpO2: 99% 98%    History of present illness:  George Bridges is a 62 year old male with a past medical history significant for type 2 diabetes on glimepiride, depression, denies current or previous use of alcohol who presented to the ED due to emotional distress.  Found to  be hypoglycemic which was corrected in the ED. Self reported melena with positive FOBT.  GI consulted.  EGD done on 08/08/2017 revealed large esophageal varices with 3 bands placed, diffuse hemorrhagic gastritis.  Superficial/linear gastric and duodenal bulb ulcers.  Newly diagnosed cirrhosis.  Post paracentesis on 08/09/2017 by interventional radiology for which 3.6 L of light yellow fluid was removed.  08/11/2017: Patient seen and examined at his bedside.  He has no new complaints.  Denies nausea or abdominal pain.  Denies recurrent GI bleed during this hospitalization.  On the day of discharge, the patient was hemodynamically stable.  He will need to follow-up with GI, PCP post hospitalization.    Hospital Course:  Principal Problem:   GI bleed Active Problems:   Other cirrhosis of liver (HCC)   DM type 2 (diabetes mellitus, type 2) (HCC)   HTN (hypertension)   Newly diagnosed cirrhosis with unclear etiology EGD done on 08/08/2017 revealed large esophageal varices, diffuse hemorrhagic gastritis, superficial/linear gastric and duodenal bulb ulcers. Denies current or previous alcohol use Acute hepatitis viral panel negative Normal alpha-1 antitrypsin Normal ceruloplasmin level Completed octreotide drip Completed IV ceftriaxone Continue PPI twice daily Continue p.o. Spironolactone Continue Lasix p.o. 20 mg daily Post paracentesis done on 08/09/2017  Upper GI bleed, resolved Hemoglobin is stable EGD recommended in 3 to 4 weeks for further banding and ligation  Acute blood loss anemia Management as stated above  Severe protein calorie deficiency Albumin 2.5 Most likely secondary to advanced liver disease  Type  2 diabetes Hemoglobin A1c 11.4 on 08/07/2017 Resume glimepiride and metformin  Chronic depression Continue fluoxetine     Procedures: EGD Paracentesis  Consultations:  GI  Discharge Exam: BP 125/64 (BP Location: Right Arm)   Pulse 72   Temp 98.6 F  (37 C) (Oral)   Resp 16   Ht 5\' 6"  (1.676 m)   Wt 95.7 kg (211 lb)   SpO2 98%   BMI 34.06 kg/m  . General: 62 y.o. year-old male well developed well nourished in no acute distress.  Alert and oriented x3. . Cardiovascular: Regular rate and rhythm with no rubs or gallops.  No thyromegaly or JVD noted.   Marland Kitchen. Respiratory: Clear to auscultation with no wheezes or rales. Good inspiratory effort. . Abdomen: Soft nontender nondistended with normal bowel sounds x4 quadrants. . Musculoskeletal: No lower extremity edema. 2/4 pulses in all 4 extremities. . Skin: No ulcerative lesions noted or rashes, . Psychiatry: Mood is appropriate for condition and setting  Discharge Instructions You were cared for by a hospitalist during your hospital stay. If you have any questions about your discharge medications or the care you received while you were in the hospital after you are discharged, you can call the unit and asked to speak with the hospitalist on call if the hospitalist that took care of you is not available. Once you are discharged, your primary care physician will handle any further medical issues. Please note that NO REFILLS for any discharge medications will be authorized once you are discharged, as it is imperative that you return to your primary care physician (or establish a relationship with a primary care physician if you do not have one) for your aftercare needs so that they can reassess your need for medications and monitor your lab values.   Allergies as of 08/11/2017      Reactions   Oxycodone Hives      Medication List    STOP taking these medications   glimepiride 2 MG tablet Commonly known as:  AMARYL   losartan 25 MG tablet Commonly known as:  COZAAR     TAKE these medications   aspirin EC 81 MG tablet Take 81 mg by mouth daily.   FLUoxetine 20 MG tablet Commonly known as:  PROZAC Take 1 tablet (20 mg total) by mouth daily.   furosemide 20 MG tablet Commonly known as:   LASIX Take 1 tablet (20 mg total) by mouth daily. Start taking on:  08/12/2017   gabapentin 400 MG capsule Commonly known as:  NEURONTIN Take 400 mg by mouth 3 (three) times daily as needed (pain).   insulin aspart 100 UNIT/ML injection Commonly known as:  novoLOG Inject 0-15 Units into the skin 3 (three) times daily with meals.   insulin aspart 100 UNIT/ML injection Commonly known as:  novoLOG Inject 0-5 Units into the skin at bedtime.   metFORMIN 500 MG tablet Commonly known as:  GLUCOPHAGE Take 1 tablet (500 mg total) by mouth 2 (two) times daily with a meal. What changed:  when to take this   pantoprazole 40 MG tablet Commonly known as:  PROTONIX Take 1 tablet (40 mg total) by mouth 2 (two) times daily.   spironolactone 25 MG tablet Commonly known as:  ALDACTONE Take 1 tablet (25 mg total) by mouth daily. Start taking on:  08/12/2017      Allergies  Allergen Reactions  . Oxycodone Hives   Follow-up Information    Kathi DerBrahmbhatt, Parag, MD. Schedule an appointment as soon  as possible for a visit in 4 week(s).   Specialty:  Gastroenterology Why:  newly diagnosed cirrhosis. Follow-up in 3-4 weeks. Contact information: [address] [place] [address] [phone]        Maudie Flakes, FNP. Call in 1 day(s).   Specialty:  Family Medicine Why:  Please call for an appointment. Contact information: [address] [place] [address] [phone]            The results of significant diagnostics from this hospitalization (including imaging, microbiology, ancillary and laboratory) are listed below for reference.    Significant Diagnostic Studies: Ct Abdomen Pelvis W Contrast  Result Date: 08/07/2017 CLINICAL DATA:  Emotionally distraught.  Depressed.  Melena. EXAM: CT ABDOMEN AND PELVIS WITH CONTRAST TECHNIQUE: Multidetector CT imaging of the abdomen and pelvis was performed using the standard protocol following bolus administration  of intravenous contrast. CONTRAST:  ISOVUE-300 IOPAMIDOL (ISOVUE-300) INJECTION 61% COMPARISON:  None. FINDINGS: Lower chest: Lung bases are clear. Hepatobiliary: Probable hepatic cirrhosis with enlarged left lateral segment and caudate lobes. No focal liver lesions. Gallbladder and bile ducts are unremarkable. Pancreas: Unremarkable. No pancreatic ductal dilatation or surrounding inflammatory changes. Spleen: Normal in size without focal abnormality. Adrenals/Urinary Tract: Adrenal glands are unremarkable. Kidneys are normal, without renal calculi, focal lesion, or hydronephrosis. Bladder is unremarkable. Stomach/Bowel: Stomach, small bowel, and colon are not abnormally distended. No wall thickening or inflammatory changes appreciated. Appendix is not identified. Vascular/Lymphatic: Scattered calcification of the abdominal aorta. No aneurysm. Retroperitoneal lymph nodes are not pathologically enlarged. Reproductive: Prostate gland is enlarged, measuring 5.3 cm diameter. Other: Diffuse free fluid throughout the abdomen and pelvis consistent with moderate ascites. No free air. Abdominal wall musculature appears intact. Musculoskeletal: No acute or significant osseous findings. IMPRESSION: 1. Configuration of the liver is consistent with hepatic cirrhosis. 2. Diffuse abdominal and pelvic ascites. 3. Enlarged prostate gland. 4. Aortic atherosclerosis. Electronically Signed   By: Burman Nieves M.D.   On: 08/07/2017 05:58   US Paracentesis  Result Date: 08/09/2017 INDICATION: Patient with history of cirrhosis by imaging, esophageal varices, ascites. Request made for diagnostic and therapeutic paracentesis up to 5 liters. EXAM: ULTRASOUND GUIDED DIAGNOSTIC AND THERAPEUTIC PARACENTESIS MEDICATIONS: None COMPLICATIONS: None immediate. PROCEDURE: Informed written consent was obtained from the patient after a discussion of the risks, benefits and alternatives to treatment. A timeout was performed prior to the  initiation of the procedure. Initial ultrasound scanning demonstrates a large amount of ascites within the left lower abdominal quadrant. The left lower abdomen was prepped and draped in the usual sterile fashion. 1% lidocaine was used for local anesthesia. Following this, a 19 gauge, 7-cm, Yueh catheter was introduced. An ultrasound image was saved for documentation purposes. The paracentesis was performed. The catheter was removed and a dressing was applied. The patient tolerated the procedure well without immediate post procedural complication. FINDINGS: A total of approximately 3.6 liters of light yellow fluid was removed. Samples were sent to the laboratory as requested by the clinical team. IMPRESSION: Successful ultrasound-guided diagnostic and therapeutic paracentesis yielding 3.6 liters of peritoneal fluid. Read by: Jeananne Rama, PA-C Electronically Signed   By: Corlis Leak M.D.   On: 08/09/2017 14:55    Microbiology: Recent Results (from the past 240 hour(s))  MRSA PCR Screening     Status: None   Collection Time: 08/07/17  8:58 AM  Result Value Ref Range Status   MRSA by PCR NEGATIVE NEGATIVE Final    Comment:  The GeneXpert MRSA Assay (FDA approved for NASAL specimens only), is one component of a comprehensive MRSA colonization surveillance program. It is not intended to diagnose MRSA infection nor to guide or monitor treatment for MRSA infections. Performed at [facility], [address] [place], [place] [address]   Culture, body fluid-bottle     Status: None (Preliminary result)   Collection Time: 08/09/17  2:30 PM  Result Value Ref Range Status   Specimen Description FLUID PERITONEAL  Final   Special Requests BOTTLES DRAWN AEROBIC AND ANAEROBIC  Final   Culture   Final    NO GROWTH 2 DAYS Performed at [facility], [address], [place], [place] [address]    Report Status PENDING  Incomplete  Gram stain     Status: None   Collection  Time: 08/09/17  2:30 PM  Result Value Ref Range Status   Specimen Description FLUID PERITONEAL  Final   Special Requests NONE  Final   Gram Stain   Final    WBC PRESENT, PREDOMINANTLY MONONUCLEAR NO ORGANISMS SEEN CYTOSPIN SMEAR Performed at [facility], [address], [place], [place] [address]    Report Status 08/09/2017 FINAL  Final     Labs: Basic Metabolic Panel: Recent Labs  Lab 08/07/17 0403 08/08/17 0337 08/09/17 0334 08/11/17 0410  NA 131* 134* 134* 134*  K 3.5 4.5 4.3 4.0  CL 96* 104 108 108  CO2 23 24 22  20*  GLUCOSE 432* 235* 223* 175*  BUN 52* 40* 25* 11  CREATININE 1.20 1.16 1.01 0.86  CALCIUM 8.2* 7.7* 7.4* 7.4*   Liver Function Tests: Recent Labs  Lab 08/07/17 0403 08/08/17 0337 08/09/17 0334 08/11/17 0410  AST 50* 98* 91* 44*  ALT 45 64* 65* 50*  ALKPHOS 57 52 58 54  BILITOT 1.4* 1.7* 1.1 0.9  PROT 6.0* 5.6* 5.5* 5.3*  ALBUMIN 2.8* 2.6* 2.5* 2.3*   No results for input(s): LIPASE, AMYLASE in the last 168 hours. No results for input(s): AMMONIA in the last 168 hours. CBC: Recent Labs  Lab 08/07/17 0403 08/07/17 1432 08/08/17 0337 08/09/17 0334 08/10/17 0423 08/11/17 0410  WBC 8.0  --  5.0 4.3 3.6* 3.7*  NEUTROABS 4.6  --   --   --   --   --   HGB 8.7* 9.1* 8.8* 8.7* 8.5* 9.0*  HCT 25.5* 26.1* 25.8* 26.0* 25.8* 26.6*  MCV 95.5  --  94.9 97.0 96.3 97.8  PLT 97*  --  79* 83* 70* 70*   Cardiac Enzymes: No results for input(s): CKTOTAL, CKMB, CKMBINDEX, TROPONINI in the last 168 hours. BNP: BNP (last 3 results) No results for input(s): BNP in the last 8760 hours.  ProBNP (last 3 results) No results for input(s): PROBNP in the last 8760 hours.  CBG: Recent Labs  Lab 08/10/17 1142 08/10/17 1742 08/10/17 2208 08/11/17 0721 08/11/17 1201  GLUCAP 260* 289* 230* 164* 288*       Signed:  Darlin Drop, MD [facility] 08/11/2017, 3:01 PM

## 2017-08-11 NOTE — Discharge Instructions (Signed)
Cirrhosis Cirrhosis is long-term (chronic) liver injury. The liver is your largest internal organ, and it performs many functions. The liver converts food into energy, removes toxic material from your blood, makes important proteins, and absorbs necessary vitamins from your diet. If you have cirrhosis, it means many of your healthy liver cells have been replaced by scar tissue. This prevents blood from flowing through your liver, which makes it difficult for your liver to function. This scarring is not reversible, but treatment can prevent it from getting worse. What are the causes? Hepatitis C and long-term alcohol abuse are the most common causes of cirrhosis. Other causes include:  Nonalcoholic fatty liver disease.  Hepatitis B infection.  Autoimmune hepatitis.  Diseases that cause blockage of ducts inside the liver.  Inherited liver diseases.  Reactions to certain long-term medicines.  Parasitic infections.  Long-term exposure to certain toxins.  What increases the risk? You may have a higher risk of cirrhosis if you:  Have certain hepatitis viruses.  Abuse alcohol, especially if you are male.  Are overweight.  Share needles.  Have unprotected sex with someone who has hepatitis.  What are the signs or symptoms? You may not have any signs and symptoms at first. Symptoms may not develop until the damage to your liver starts to get worse. Signs and symptoms of cirrhosis may include:  Tenderness in the right-upper part of your abdomen.  Weakness and tiredness (fatigue).  Loss of appetite.  Nausea.  Weight loss and muscle loss.  Itchiness.  Yellow skin and eyes (jaundice).  Buildup of fluid in the abdomen (ascites).  Swelling of the feet and ankles (edema).  Appearance of tiny blood vessels under the skin.  Mental confusion.  Easy bruising and bleeding.  How is this diagnosed? Your health care provider may suspect cirrhosis based on your symptoms and  medical history, especially if you have other medical conditions or a history of alcohol abuse. Your health care provider will do a physical exam to feel your liver and check for signs of cirrhosis. Your health care provider may perform other tests, including:  Blood tests to check: ? Whether you have hepatitis B or C. ? Kidney function. ? Liver function.  Imaging tests such as: ? MRI or CT scan to look for changes seen in advanced cirrhosis. ? Ultrasound to see if normal liver tissue is being replaced by scar tissue.  A procedure using a long needle to take a sample of liver tissue (biopsy) for examination under a microscope. Liver biopsy can confirm the diagnosis of cirrhosis.  How is this treated? Treatment depends on how damaged your liver is and what caused the damage. Treatment may include treating cirrhosis symptoms or treating the underlying causes of the condition to try to slow the progression of the damage. Treatment may include:  Making lifestyle changes, such as: ? Eating a healthy diet. ? Restricting salt intake. ? Maintaining a healthy weight. ? Not abusing drugs or alcohol.  Taking medicines to: ? Treat liver infections or other infections. ? Control itching. ? Reduce fluid buildup. ? Reduce certain blood toxins. ? Reduce risk of bleeding from enlarged blood vessels in the stomach or esophagus (varices).  If varices are causing bleeding problems, you may need treatment with a procedure that ties up the vessels causing them to fall off (band ligation).  If cirrhosis is causing your liver to fail, your health care provider may recommend a liver transplant.  Other treatments may be recommended depending on any complications  of cirrhosis, such as liver-related kidney failure (hepatorenal syndrome).  Follow these instructions at home:  Take medicines only as directed by your health care provider. Do not use drugs that are toxic to your liver. Ask your health care  provider before taking any new medicines, including over-the-counter medicines.  Rest as needed.  Eat a well-balanced diet. Ask your health care provider or dietitian for more information.  You may have to follow a low-salt diet or restrict your water intake as directed.  Do not drink alcohol. This is especially important if you are taking acetaminophen.  Keep all follow-up visits as directed by your health care provider. This is important. Contact a health care provider if:  You have fatigue or weakness that is getting worse.  You develop swelling of the hands, feet, legs, or face.  You have a fever.  You develop loss of appetite.  You have nausea or vomiting.  You develop jaundice.  You develop easy bruising or bleeding. Get help right away if:  You vomit bright red blood or a material that looks like coffee grounds.  You have blood in your stools.  Your stools appear black and tarry.  You become confused.  You have chest pain or trouble breathing. This information is not intended to replace advice given to you by your health care provider. Make sure you discuss any questions you have with your health care provider. Document Released: 02/01/2005 Document Revised: 06/12/2015 Document Reviewed: 10/10/2013 Elsevier Interactive Patient Education  2018 [employer].   Esophageal Varices The esophagus is the passage that connects the throat to the stomach. Esophageal varices are blood vessels in the esophagus that have become enlarged. They develop when extra blood is forced to flow through them because the blood's normal pathway is blocked. Without treatment these blood vessels eventually break and bleed (hemorrhage). A hemorrhage is life-threatening. What are the causes? This condition may be caused by:  Scarring of the liver (cirrhosis) due to alcoholism. This is the most common cause.  Liver disease.  Severe heart failure.  A blood clot in the portal  vein.  Sarcoidosis. This is an inflammatory disease that can affect the liver.  Schistosomiasis. This is a parasitic infection that can cause liver damage.  What are the signs or symptoms? Usually there are no symptoms unless the esophageal varices bleed. Symptoms of bleeding esophageal varices include:  Vomiting material that is bright red or that is black and looks like coffee grounds.  Coughing up blood.  Black, tarry stools.  Dizziness or lightheadedness.  Low blood pressure.  Loss of consciousness.  How is this diagnosed? This condition is diagnosed with tests, such as:  Endoscopy. During this test a thin, lighted tube is inserted through the mouth and into the esophagus.  Blood tests. These may be done to check liver function, blood counts, and the body's ability to form blood clots.  How is this treated? This condition may be treated with:  Medicines that reduce pressure in the esophageal varices and reduce the risk of bleeding.  Procedures to reduce pressure in the esophageal varices and reduce the risk of bleeding or stop bleeding. These include: ? Variceal ligation. In this procedure, a rubber band is placed around the esophageal varices to keep them from bleeding. ? Injection therapy. This treatment involves an injection that causes the esophageal varices to shrink and close (sclerotherapy). Medicines that tighten blood vessels or alter blood flow may also be used. ? Balloon tamponade. In this procedure, a  tube is put into the esophagus and a balloon is passed through it and inflated. ? Transjugular intrahepatic portosystemic shunt (TIPS) placement. In this procedure, a small tube is placed within the liver veins. This decreases blood flow and pressure to the esophageal varices.  A liver transplant. This may be done if other treatments do not work.  Follow these instructions at home:  Take medicines only as directed by your health care provider.  Follow your  health care provider's instructions about rest and physical activity. Get help right away if:  You have any symptoms of this condition after treatment.  You are unable to eat or drink.  You have chest pain. This information is not intended to replace advice given to you by your health care provider. Make sure you discuss any questions you have with your health care provider. Document Released: 04/24/2003 Document Revised: 07/10/2015 Document Reviewed: 01/28/2014 Elsevier Interactive Patient Education  2018 [employer].   Gastrointestinal Bleeding Gastrointestinal bleeding is bleeding somewhere along the path food travels through the body (digestive tract). This path is anywhere between the mouth and the opening of the butt (anus). You may have blood in your poop (stools) or have black poop. If you throw up (vomit), there may be blood in it. This condition can be mild, serious, or even life-threatening. If you have a lot of bleeding, you may need to stay in the hospital. Follow these instructions at home:  Take over-the-counter and prescription medicines only as told by your doctor.  Eat foods that have a lot of fiber in them. These foods include whole grains, fruits, and vegetables. You can also try eating 1-3 prunes each day.  Drink enough fluid to keep your pee (urine) clear or pale yellow.  Keep all follow-up visits as told by your doctor. This is important. Contact a doctor if:  Your symptoms do not get better. Get help right away if:  Your bleeding gets worse.  You feel dizzy or you pass out (faint).  You feel weak.  You have very bad cramps in your back or belly (abdomen).  You pass large clumps of blood (clots) in your poop.  Your symptoms are getting worse. This information is not intended to replace advice given to you by your health care provider. Make sure you discuss any questions you have with your health care provider. Document Released: 11/11/2007 Document  Revised: 07/10/2015 Document Reviewed: 07/22/2014 Elsevier Interactive Patient Education  2018 [employer]  Paracentesis, Care After Refer to this sheet in the next few weeks. These instructions provide you with information about caring for yourself after your procedure. Your health care provider may also give you more specific instructions. Your treatment has been planned according to current medical practices, but problems sometimes occur. Call your health care provider if you have any problems or questions after your procedure. What can I expect after the procedure? After your procedure, it is common to have a small amount of clear fluid coming from the puncture site. Follow these instructions at home:  Return to your normal activities as told by your health care provider. Ask your health care provider what activities are safe for you.  Take over-the-counter and prescription medicines only as told by your health care provider.  Do not take baths, swim, or use a hot tub until your health care provider approves.  Follow instructions from your health care provider about: ? How to take care of your puncture site. ? When and how you should change  your bandage (dressing). ? When you should remove your dressing.  Check your puncture area every day signs of infection. Watch for: ? Redness, swelling, or pain. ? Fluid, blood, or pus.  Keep all follow-up visits as told by your health care provider. This is important. Contact a health care provider if:  You have redness, swelling, or pain at your puncture site.  You start to have more clear fluid coming from your puncture site.  You have blood or pus coming from your puncture site.  You have chills.  You have a fever. Get help right away if:  You develop chest pain or shortness of breath.  You develop increasing pain, discomfort, or swelling in your abdomen.  You feel dizzy or light-headed or you pass out. This information is not  intended to replace advice given to you by your health care provider. Make sure you discuss any questions you have with your health care provider. Document Released: 06/18/2014 Document Revised: 07/10/2015 Document Reviewed: 04/16/2014 Elsevier Interactive Patient Education  2018 [employer]

## 2017-08-11 NOTE — Progress Notes (Signed)
Spoke with Dr. Margo AyeHall via phone recently regarding Insulin orders for home. Pt has never used insulin before. Stated "doesn't check my sugar regularly, but plan to start," and admitted supplies were low. CBG's labile. Md decided to cancel dc and follow up with diabetes coordinator and start pt on long-acting insulin in addition to sliding scale. Pt made aware and reassured.

## 2017-08-11 NOTE — Care Management Note (Signed)
Case Management Note  Patient Details  Name: George Bridges MRN: [id number] Date of Birth: 04-Apr-1955  Home health RN ordered by MD. This CM met with pt at bedside. Pt declines home health services at this time and states that he doesn't need any DME either.    Lynnell Catalan, RN 08/11/2017, 3:13 PM [phone]

## 2017-08-11 NOTE — Evaluation (Signed)
Physical Therapy Evaluation Patient Details Name: Truman HaywardRichard Soth MRN: [id number] DOB: 11/09/1955 Today's Date: 08/11/2017   History of Present Illness  Pt is a 62 yr old male admitted 08/07/2017 with diagnosis of GI bleed. PMH: DM2, HTN, cirrhosis of the liver.  Clinical Impression  Patient independent or modified independent with all mobility. No overt balance issues noted. Fair endurance for activity. Patient evaluated by Physical Therapy with no further acute PT needs identified. All education has been completed and the patient has no further questions.  See below for any follow-up Physical Therapy or equipment needs. PT is signing off. Thank you for this referral.     Follow Up Recommendations No PT follow up    Equipment Recommendations  None recommended by PT    Recommendations for Other Services       Precautions / Restrictions Restrictions Weight Bearing Restrictions: No      Mobility  Bed Mobility Overal bed mobility: Independent                Transfers Overall transfer level: Independent Equipment used: None                Ambulation/Gait Ambulation/Gait assistance: Modified independent (Device/Increase time) Gait Distance (Feet): 450 Feet Assistive device: None Gait Pattern/deviations: Step-through pattern;Decreased stride length Gait velocity: mildly decreased   General Gait Details: no overt LOB; pt would slow down a little to converse with PT.  Stairs            Wheelchair Mobility    Modified Rankin (Stroke Patients Only)       Balance Overall balance assessment: Independent                                           Pertinent Vitals/Pain Pain Assessment: No/denies pain    Home Living Family/patient expects to be discharged to:: Private residence Living Arrangements: Alone   Type of Home: House Home Access: Stairs to enter Entrance Stairs-Rails: Doctor, general practiceight;Left Entrance Stairs-Number of Steps: 4 Home  Layout: One level Home Equipment: None      Prior Function Level of Independence: Independent               Hand Dominance        Extremity/Trunk Assessment   Upper Extremity Assessment Upper Extremity Assessment: Overall WFL for tasks assessed    Lower Extremity Assessment Lower Extremity Assessment: Overall WFL for tasks assessed       Communication   Communication: No difficulties  Cognition Arousal/Alertness: Awake/alert Behavior During Therapy: WFL for tasks assessed/performed Overall Cognitive Status: Within Functional Limits for tasks assessed                                        General Comments      Exercises     Assessment/Plan    PT Assessment Patent does not need any further PT services  PT Problem List         PT Treatment Interventions      PT Goals (Current goals can be found in the Care Plan section)  Acute Rehab PT Goals Patient Stated Goal: go home  PT Goal Formulation: With patient Time For Goal Achievement: 08/18/17 Potential to Achieve Goals: Good    Frequency     Barriers to discharge  Co-evaluation               AM-PAC PT "6 Clicks" Daily Activity  Outcome Measure Difficulty turning over in bed (including adjusting bedclothes, sheets and blankets)?: None Difficulty moving from lying on back to sitting on the side of the bed? : None Difficulty sitting down on and standing up from a chair with arms (e.g., wheelchair, bedside commode, etc,.)?: None Help needed moving to and from a bed to chair (including a wheelchair)?: None Help needed walking in hospital room?: None Help needed climbing 3-5 steps with a railing? : A Little 6 Click Score: 23    End of Session   Activity Tolerance: Patient tolerated treatment well Patient left: in chair;with call bell/phone within reach Nurse Communication: Mobility status      Time: 1478-2956 PT Time Calculation (min) (ACUTE ONLY): 28  min   Charges:   PT Evaluation $PT Eval Low Complexity: 1 Low PT Treatments $Gait Training: 8-22 mins   PT G Codes:        Larico Dimock D. Hartnett-Rands, MS, PT Per Diem PT [facility] #[id number] 08/11/2017, 3:45 PM

## 2017-08-11 NOTE — Progress Notes (Signed)
[facility]  George HaywardRichard Bridges 62 y.o. 04/25/1955  CC:  Melena, cirrhosis   Subjective:  Doing better after paracentesis. Abdominal distention improved. Denies nausea vomiting. Denied further bleeding episodes.  ROS: no Chest pain.  Denies shortness of breath.   Objective: Vital signs in last 24 hours: Vitals:   08/10/17 2003 08/11/17 0604  BP: 99/63 114/72  Pulse: 63 63  Resp: 17 17  Temp: 98.3 F (36.8 C) 97.9 F (36.6 C)  SpO2: 98% 99%    Physical Exam:  Gen. Alert/oriented 3. Not in acute distress Abdomen. Abdomen remains mildly distended,  bowel sounds present, no peritoneal signs. Lower extremity.trace edema.  Psych : mood and affect normal.  Lab Results: Recent Labs    08/09/17 0334 08/11/17 0410  NA 134* 134*  K 4.3 4.0  CL 108 108  CO2 22 20*  GLUCOSE 223* 175*  BUN 25* 11  CREATININE 1.01 0.86  CALCIUM 7.4* 7.4*   Recent Labs    08/09/17 0334 08/11/17 0410  AST 91* 44*  ALT 65* 50*  ALKPHOS 58 54  BILITOT 1.1 0.9  PROT 5.5* 5.3*  ALBUMIN 2.5* 2.3*   Recent Labs    08/10/17 0423 08/11/17 0410  WBC 3.6* 3.7*  HGB 8.5* 9.0*  HCT 25.8* 26.6*  MCV 96.3 97.8  PLT 70* 70*   No results for input(s): LABPROT, INR in the last 72 hours.    Assessment/Plan: - melena in setting of CT scan showing cirrhosis . EGD 06/24  large esophageal varices. 3 bands placed. It also showed Diffuse hemorrhagic gastritis,superficial/linear gastric and duodenal bulb ulcers.biopsies negative for H. Pylori and intestinal metaplasia. - acute blood loss anemia. Hemoglobin stable - newly diagnosed cirrhosis. MELD score 17 as of 06/24 .  Denies alcohol use although AST > ALT , concerning for underlying alcohol use. Mildly positive antimitochondrial antibody at 26.8.his alkaline phosphatase is normal. -Ascites.status post paracentesis yesterday 06/25 with removal of 3.6 L of fluids.Albumin less than 1. Negative for SBP. Cytology also  negative.  Recommendations ------------------------- - Continue Lasix 20 mg and Aldactone 25 mg.D/C octreotide.Continue twice a day PPI for 6 weeks followed by once a day PPI. -   2 g sodium diet.  - patient declined use of beta blocker at this time because he has  ongoing weakness and fatigue which we'll get worse with starting beta blocker.  - AMA mildly positive at 26.8, recommend repeat AMA  as an outpatient in 4 weeks. -  HBsAG and HCV AB negative.ASMA  Negative.  Normal Alpha 1 antitrypsin, Normal ceruloplasmin level. - recommend repeat EGD in 3-4 weeks for further band ligation.  - okay to discharge home from GI standpoint. GI will sign off. Call us back if needed.   Kathi DerParag Greysen Swanton MD, FACP 08/11/2017, 9:09 AM  Contact #  [phone]

## 2017-08-12 DIAGNOSIS — E118 Type 2 diabetes mellitus with unspecified complications: Secondary | ICD-10-CM

## 2017-08-12 LAB — COMPREHENSIVE METABOLIC PANEL
ALBUMIN: 2.5 g/dL — AB (ref 3.5–5.0)
ALT: 44 U/L (ref 0–44)
AST: 35 U/L (ref 15–41)
Alkaline Phosphatase: 57 U/L (ref 38–126)
Anion gap: 6 (ref 5–15)
BUN: 9 mg/dL (ref 8–23)
CHLORIDE: 104 mmol/L (ref 98–111)
CO2: 24 mmol/L (ref 22–32)
Calcium: 7.7 mg/dL — ABNORMAL LOW (ref 8.9–10.3)
Creatinine, Ser: 0.83 mg/dL (ref 0.61–1.24)
GFR calc Af Amer: >60 mL/min (ref >60)
GFR calc non Af Amer: >60 mL/min (ref >60)
GLUCOSE: 228 mg/dL — AB (ref 70–99)
Potassium: 4 mmol/L (ref 3.5–5.1)
Sodium: 134 mmol/L — ABNORMAL LOW (ref 135–145)
Total Bilirubin: 1.2 mg/dL (ref 0.3–1.2)
Total Protein: 5.6 g/dL — ABNORMAL LOW (ref 6.5–8.1)

## 2017-08-12 LAB — CBC
HEMATOCRIT: 27.3 % — AB (ref 39.0–52.0)
Hemoglobin: 9.1 g/dL — ABNORMAL LOW (ref 13.0–17.0)
MCH: 32.2 pg (ref 26.0–34.0)
MCHC: 33.3 g/dL (ref 30.0–36.0)
MCV: 96.5 fL (ref 78.0–100.0)
PLATELETS: 74 10*3/uL — AB (ref 150–400)
RBC: 2.83 MIL/uL — AB (ref 4.22–5.81)
RDW: 14.4 % (ref 11.5–15.5)
WBC: 3.4 10*3/uL — AB (ref 4.0–10.5)

## 2017-08-12 LAB — GLUCOSE, CAPILLARY
GLUCOSE-CAPILLARY: 194 mg/dL — AB (ref 70–99)
GLUCOSE-CAPILLARY: 289 mg/dL — AB (ref 70–99)
GLUCOSE-CAPILLARY: 79 mg/dL (ref 70–99)
GLUCOSE-CAPILLARY: 164 mg/dL — AB (ref 70–99)
Glucose-Capillary: 123 mg/dL — ABNORMAL HIGH (ref 70–99)

## 2017-08-12 LAB — HEMOGLOBIN A1C
HEMOGLOBIN A1C: 10 % — AB (ref 4.8–5.6)
MEAN PLASMA GLUCOSE: 240.3 mg/dL

## 2017-08-12 MED ORDER — INSULIN ASPART 100 UNIT/ML ~~LOC~~ SOLN
10.0000 [IU] | Freq: Three times a day (TID) | SUBCUTANEOUS | Status: DC
  Administered 2017-08-12 (×2): 10 [IU] via SUBCUTANEOUS

## 2017-08-12 MED ORDER — INSULIN GLARGINE 100 UNIT/ML ~~LOC~~ SOLN
13.0000 [IU] | SUBCUTANEOUS | Status: DC
  Filled 2017-08-12: qty 0.13

## 2017-08-12 MED ORDER — INSULIN ASPART PROT & ASPART (70-30 MIX) 100 UNIT/ML ~~LOC~~ SUSP
15.0000 [IU] | Freq: Two times a day (BID) | SUBCUTANEOUS | Status: DC
  Administered 2017-08-12 – 2017-08-13 (×2): 15 [IU] via SUBCUTANEOUS
  Filled 2017-08-12 (×2): qty 10

## 2017-08-12 MED ORDER — INSULIN ASPART PROT & ASPART (70-30 MIX) 100 UNIT/ML ~~LOC~~ SUSP: 15.0000 [IU] | Freq: Two times a day (BID) | SUBCUTANEOUS | 0 refills | Status: AC

## 2017-08-12 MED ORDER — INSULIN STARTER KIT- PEN NEEDLES (ENGLISH)
1.0000 | Freq: Once | Status: AC
  Administered 2017-08-12: 1
  Filled 2017-08-12: qty 1

## 2017-08-12 MED ORDER — INSULIN STARTER KIT- PEN NEEDLES (ENGLISH): 1.0000 | Freq: Once | 0 refills | Status: AC

## 2017-08-12 NOTE — Progress Notes (Signed)
Discharge Summary  George Bridges YSA:[id number] DOB: Jul 12, 1955  PCP: George Hams, FNP  Admit date: 08/07/2017 Discharge date: 08/12/2017  Time spent: 25 minutes  Discharge delayed to educate the patient on insulin and diabetes management. Diabetes coordinator consulted to assist.  Highly appreciated.  08/12/17: Patient seen and examined at his bedside.  He has no new complaints.  However he needs reinforcement on insulin education and management of his diabetes.  Diabetes coordinator following.  Highly appreciate recommendations.  Recommendations for Outpatient Follow-up:  1. Follow-up with GI 2. Follow-up with PCP 3. Take your medications as prescribed 4. Start 2 g sodium diet  Recommendations from GI: ------------------------- - Continue Lasix 20 mg and Aldactone 25 mg.D/C octreotide.Continue twice a day PPI for 6 weeks followed by once a day PPI. -   2 g sodium diet. - patient declined use of beta blocker at this time because he has  ongoing weakness and fatigue which we'll get worse with starting beta blocker. - AMA mildly positive at 26.8, recommend repeat AMA  as an outpatient in 4 weeks. -  HBsAG and HCV AB negative.ASMA  Negative.  Normal Alpha 1 antitrypsin, Normal ceruloplasmin level. - recommend repeat EGD in 3-4 weeks for further band ligation. - okay to discharge home from GI standpoint. GI will sign off. Call us back if needed.   Discharge Diagnoses:  Active Hospital Problems   Diagnosis Date Noted  . GI bleed 08/07/2017  . Other cirrhosis of liver ([place]) 08/07/2017  . DM type 2 (diabetes mellitus, type 2) ([place]) 08/07/2017  . HTN (hypertension) 08/07/2017    Resolved Hospital Problems  No resolved problems to display.    Discharge Condition: Stable  Diet recommendation: 2 g sodium diet Vitals:   08/12/17 0523 08/12/17 1308  BP: 118/73 (!) 97/58  Pulse: (!) 55 76  Resp: 18 16  Temp: 98.4 F (36.9 C) 98.6 F (37 C)  SpO2: 100% 97%    History  of present illness:  [name] is a 62 year old male with a past medical history significant for type 2 diabetes on glimepiride, depression, denies current or previous use of alcohol who presented to the ED due to emotional distress.  Found to be hypoglycemic which was corrected in the ED. Self reported melena with positive FOBT.  GI consulted.  EGD done on 08/08/2017 revealed large esophageal varices with 3 bands placed, diffuse hemorrhagic gastritis.  Superficial/linear gastric and duodenal bulb ulcers.  Newly diagnosed cirrhosis.  Post paracentesis on 08/09/2017 by interventional radiology for which 3.6 L of light yellow fluid was removed.  08/11/2017: Patient seen and examined at his bedside.  He has no new complaints.  Denies nausea or abdominal pain.  Denies recurrent GI bleed during this hospitalization.  On the day of discharge, the patient was hemodynamically stable.  He will need to follow-up with GI, PCP post hospitalization.    Hospital Course:  Principal Problem:   GI bleed Active Problems:   Other cirrhosis of liver ([facility])   DM type 2 (diabetes mellitus, type 2) ([place])   HTN (hypertension)   Newly diagnosed cirrhosis with unclear etiology EGD done on 08/08/2017 revealed large esophageal varices, diffuse hemorrhagic gastritis, superficial/linear gastric and duodenal bulb ulcers. Denies current or previous alcohol use Acute hepatitis viral panel negative Normal alpha-1 antitrypsin Normal ceruloplasmin level Completed octreotide drip Completed IV ceftriaxone Continue PPI twice daily Continue p.o. Spironolactone Continue Lasix p.o. 20 mg daily Post paracentesis done on 08/09/2017  Type 2 diabetes now insulin-dependent Last  A1c 11.4 on 08/07/2017 Repeat A1c to confirm Diabetes coordinator following Stop Lantus and NovoLog Start 70/30 insulin 15 units twice daily Continue insulin education for patient safety Continue regular Accu-Cheks Stop glimepiride and metformin  Upper  GI bleed, resolved Hemoglobin is stable Repeat EGD recommended in 3 to 4 weeks for further banding and ligation  Acute blood loss anemia Management as stated above  Severe protein calorie deficiency Albumin 2.5 Most likely secondary to advanced liver disease  Chronic depression Continue fluoxetine    Procedures: EGD Paracentesis  Consultations:  GI  Discharge Exam: BP (!) 97/58 (BP Location: Right Arm)   Pulse 76   Temp 98.6 F (37 C) (Oral)   Resp 16   Ht '5\' 6"'  (1.676 m)   Wt 95.7 kg (211 lb)   SpO2 97%   BMI 34.06 kg/m  . General: 62 y.o. year-old male obese in no acute distress.  Alert and oriented x3. . Cardiovascular: Regular rate and rhythm with no rubs or gallops.  No thyromegaly or JVD noted. Marland Kitchen Respiratory: Clear to auscultation with no wheezes or rales. Good inspiratory effort. . Abdomen: Soft nontender nondistended with normal bowel sounds x4 quadrants. . Musculoskeletal: No lower extremity edema. 2/4 pulses in all 4 extremities. . Skin: No ulcerative lesions noted or rashes, . Psychiatry: Mood is appropriate for condition and setting  Discharge Instructions You were cared for by a hospitalist during your hospital stay. If you have any questions about your discharge medications or the care you received while you were in the hospital after you are discharged, you can call the unit and asked to speak with the hospitalist on call if the hospitalist that took care of you is not available. Once you are discharged, your primary care physician will handle any further medical issues. Please note that NO REFILLS for any discharge medications will be authorized once you are discharged, as it is imperative that you return to your primary care physician (or establish a relationship with a primary care physician if you do not have one) for your aftercare needs so that they can reassess your need for medications and monitor your lab values.   Allergies as of 08/12/2017       Reactions   Oxycodone Hives      Medication List    STOP taking these medications   glimepiride 2 MG tablet Commonly known as:  AMARYL   losartan 25 MG tablet Commonly known as:  COZAAR   metFORMIN 500 MG tablet Commonly known as:  GLUCOPHAGE     TAKE these medications   aspirin EC 81 MG tablet Take 81 mg by mouth daily.   FLUoxetine 20 MG tablet Commonly known as:  PROZAC Take 1 tablet (20 mg total) by mouth daily.   furosemide 20 MG tablet Commonly known as:  LASIX Take 1 tablet (20 mg total) by mouth daily.   gabapentin 400 MG capsule Commonly known as:  NEURONTIN Take 400 mg by mouth 3 (three) times daily as needed (pain).   insulin aspart protamine- aspart (70-30) 100 UNIT/ML injection Commonly known as:  NOVOLOG MIX 70/30 Inject 0.15 mLs (15 Units total) into the skin 2 (two) times daily with a meal.   insulin starter kit- pen needles Misc 1 kit by Other route once for 1 dose.   pantoprazole 40 MG tablet Commonly known as:  PROTONIX Take 1 tablet (40 mg total) by mouth 2 (two) times daily.   spironolactone 25 MG tablet Commonly known as:  ALDACTONE  Take 1 tablet (25 mg total) by mouth daily.      Allergies  Allergen Reactions  . Oxycodone Hives   Follow-up Information    Brahmbhatt, Parag, MD. Schedule an appointment as soon as possible for a visit in 4 week(s).   Specialty:  Gastroenterology Why:  newly diagnosed cirrhosis. Follow-up in 3-4 weeks. Contact information: [place] [address] [phone]        George Hams, FNP. Call in 1 day(s).   Specialty:  Family Medicine Why:  Please call for an appointment. Contact information: [place] [address] [phone]            The results of significant diagnostics from this hospitalization (including imaging, microbiology, ancillary and laboratory) are listed below for reference.    Significant Diagnostic Studies: Ct  Abdomen Pelvis W Contrast  Result Date: 08/07/2017 CLINICAL DATA:  Emotionally distraught.  Depressed.  Melena. EXAM: CT ABDOMEN AND PELVIS WITH CONTRAST TECHNIQUE: Multidetector CT imaging of the abdomen and pelvis was performed using the standard protocol following bolus administration of intravenous contrast. CONTRAST:  169m ISOVUE-300 IOPAMIDOL (ISOVUE-300) INJECTION 61% COMPARISON:  None. FINDINGS: Lower chest: Lung bases are clear. Hepatobiliary: Probable hepatic cirrhosis with enlarged left lateral segment and caudate lobes. No focal liver lesions. Gallbladder and bile ducts are unremarkable. Pancreas: Unremarkable. No pancreatic ductal dilatation or surrounding inflammatory changes. Spleen: Normal in size without focal abnormality. Adrenals/Urinary Tract: Adrenal glands are unremarkable. Kidneys are normal, without renal calculi, focal lesion, or hydronephrosis. Bladder is unremarkable. Stomach/Bowel: Stomach, small bowel, and colon are not abnormally distended. No wall thickening or inflammatory changes appreciated. Appendix is not identified. Vascular/Lymphatic: Scattered calcification of the abdominal aorta. No aneurysm. Retroperitoneal lymph nodes are not pathologically enlarged. Reproductive: Prostate gland is enlarged, measuring 5.3 cm diameter. Other: Diffuse free fluid throughout the abdomen and pelvis consistent with moderate ascites. No free air. Abdominal wall musculature appears intact. Musculoskeletal: No acute or significant osseous findings. IMPRESSION: 1. Configuration of the liver is consistent with hepatic cirrhosis. 2. Diffuse abdominal and pelvic ascites. 3. Enlarged prostate gland. 4. Aortic atherosclerosis. Electronically Signed   By: WLucienne CapersM.D.   On: 08/07/2017 05:58   [place]oreaParacentesis  Result Date: 08/09/2017 INDICATION: Patient with history of cirrhosis by imaging, esophageal varices, ascites. Request made for diagnostic and therapeutic paracentesis up to 5 liters.  EXAM: ULTRASOUND GUIDED DIAGNOSTIC AND THERAPEUTIC PARACENTESIS MEDICATIONS: None COMPLICATIONS: None immediate. PROCEDURE: Informed written consent was obtained from the patient after a discussion of the risks, benefits and alternatives to treatment. A timeout was performed prior to the initiation of the procedure. Initial ultrasound scanning demonstrates a large amount of ascites within the left lower abdominal quadrant. The left lower abdomen was prepped and draped in the usual sterile fashion. 1% lidocaine was used for local anesthesia. Following this, a 19 gauge, 7-cm, Yueh catheter was introduced. An ultrasound image was saved for documentation purposes. The paracentesis was performed. The catheter was removed and a dressing was applied. The patient tolerated the procedure well without immediate post procedural complication. FINDINGS: A total of approximately 3.6 liters of light yellow fluid was removed. Samples were sent to the laboratory as requested by the clinical team. IMPRESSION: Successful ultrasound-guided diagnostic and therapeutic paracentesis yielding 3.6 liters of peritoneal fluid. Read by: KRowe Robert PA-C Electronically Signed   By: DLucrezia EuropeM.D.   On: 08/09/2017 14:55    Microbiology: Recent Results (from the past 240 hour(s))  MRSA PCR Screening     Status: None   Collection Time: 08/07/17  8:58 AM  Result Value Ref Range Status   MRSA by PCR NEGATIVE NEGATIVE Final    Comment:        The GeneXpert MRSA Assay (FDA approved for NASAL specimens only), is one component of a comprehensive MRSA colonization surveillance program. It is not intended to diagnose MRSA infection nor to guide or monitor treatment for MRSA infections. Performed at [facility], [place] [address] [place], [place] [address]   Culture, body fluid-bottle     Status: None (Preliminary result)   Collection Time: 08/09/17  2:30 PM  Result Value Ref Range Status   Specimen Description  FLUID PERITONEAL  Final   Special Requests BOTTLES DRAWN AEROBIC AND ANAEROBIC  Final   Culture   Final    NO GROWTH 3 DAYS Performed at [facility], [place] [address], [place], [place] [address]    Report Status PENDING  Incomplete  Gram stain     Status: None   Collection Time: 08/09/17  2:30 PM  Result Value Ref Range Status   Specimen Description FLUID PERITONEAL  Final   Special Requests NONE  Final   Gram Stain   Final    WBC PRESENT, PREDOMINANTLY MONONUCLEAR NO ORGANISMS SEEN CYTOSPIN SMEAR Performed at [facility], [place] [address], [place], [place] [address]    Report Status 08/09/2017 FINAL  Final     Labs: Basic Metabolic Panel: Recent Labs  Lab 08/07/17 0403 08/08/17 0337 08/09/17 0334 08/11/17 0410 08/12/17 0423  NA 131* 134* 134* 134* 134*  K 3.5 4.5 4.3 4.0 4.0  CL 96* 104 108 108 104  CO2 '23 24 22 ' 20* 24  GLUCOSE 432* 235* 223* 175* 228*  BUN 52* 40* 25* 11 9  CREATININE 1.20 1.16 1.01 0.86 0.83  CALCIUM 8.2* 7.7* 7.4* 7.4* 7.7*   Liver Function Tests: Recent Labs  Lab 08/07/17 0403 08/08/17 0337 08/09/17 0334 08/11/17 0410 08/12/17 0423  AST 50* 98* 91* 44* 35  ALT 45 64* 65* 50* 44  ALKPHOS 57 52 58 54 57  BILITOT 1.4* 1.7* 1.1 0.9 1.2  PROT 6.0* 5.6* 5.5* 5.3* 5.6*  ALBUMIN 2.8* 2.6* 2.5* 2.3* 2.5*   No results for input(s): LIPASE, AMYLASE in the last 168 hours. No results for input(s): AMMONIA in the last 168 hours. CBC: Recent Labs  Lab 08/07/17 0403  08/08/17 0337 08/09/17 0334 08/10/17 0423 08/11/17 0410 08/12/17 0423  WBC 8.0  --  5.0 4.3 3.6* 3.7* 3.4*  NEUTROABS 4.6  --   --   --   --   --   --   HGB 8.7*   < > 8.8* 8.7* 8.5* 9.0* 9.1*  HCT 25.5*   < > 25.8* 26.0* 25.8* 26.6* 27.3*  MCV 95.5  --  94.9 97.0 96.3 97.8 96.5  PLT 97*  --  79* 83* 70* 70* 74*   < > = values in this interval not displayed.   Cardiac Enzymes: No results for input(s): CKTOTAL, CKMB, CKMBINDEX, TROPONINI in the last 168  hours. BNP: BNP (last 3 results) No results for input(s): BNP in the last 8760 hours.  ProBNP (last 3 results) No results for input(s): PROBNP in the last 8760 hours.  CBG: Recent Labs  Lab 08/11/17 1201 08/11/17 1716 08/11/17 2128 08/12/17 0738 08/12/17 1132  GLUCAP 288* 239* 204* 194* 289*       Signed:  Kayleen Memos, MD [facility] 08/12/2017, 2:28 PM

## 2017-08-12 NOTE — Progress Notes (Signed)
Inpatient Diabetes Program Recommendations  AACE/ADA: New Consensus Statement on Inpatient Glycemic Control (2015)  Target Ranges:  Prepandial:   less than 140 mg/dL      Peak postprandial:   less than 180 mg/dL (1-2 hours)      Critically ill patients:  140 - 180 mg/dL   Lab Results  Component Value Date   GLUCAP 289 (H) 08/12/2017   HGBA1C 11.4 (H) 08/07/2017    Review of Glycemic Control  Long conversation with pt regarding his diabetes control. Pt lives alone although has family in the area. States he is depressed because his fiance broke off engagement and took the dog with her and moved to [place]. We discussed how exercise will help with depression and blood sugar control. Pt states he has a glucose meter at home, but it's outdated and he would prefer to get a new one. Instructed to check blood sugars 3-4x/day and write them down in logbook for PCP to review. Will be discharged on Novolin 70/30 insulin pen ($44). Orders will be to take insulin at breakfast and dinner meals. Discussed hypoglycemia s/s and treatment. Pt voiced understanding. Stressed importance of only taking insulin at a meal. Do not take if he doesn't eat. Pt said he would change from regular soda to diet soda and make an effort to reduce portion sizes of high CHO foods. Encouraged to drink water and 0 CHO beverages. Instructed pt to call his PCP today and make appt for next week to f/u from being hospitalized.  Repeating HgbA1C. Demonstrated insulin pen use and ordered insulin pen starter kit. Will continue working with pt this afternoon for pt to demonstrate how to use insulin pen with fake stomach.  Inpatient Diabetes Program Recommendations:     70/30 15 units bid. (Titrate if blood sugar > 180 mg/dL)  Continue to follow.  Thank you. Lorenda Peck, RD, LDN, CDE Inpatient Diabetes Coordinator [phone]

## 2017-08-12 NOTE — Plan of Care (Signed)
Pt alert and oriented, resting with no complaints.  Plan of care discussed with pt, RN will monitor.

## 2017-08-13 LAB — CBC
HEMATOCRIT: 27.8 % — AB (ref 39.0–52.0)
HEMOGLOBIN: 9.5 g/dL — AB (ref 13.0–17.0)
MCH: 32.9 pg (ref 26.0–34.0)
MCHC: 34.2 g/dL (ref 30.0–36.0)
MCV: 96.2 fL (ref 78.0–100.0)
Platelets: 80 10*3/uL — ABNORMAL LOW (ref 150–400)
RBC: 2.89 MIL/uL — AB (ref 4.22–5.81)
RDW: 14.5 % (ref 11.5–15.5)
WBC: 3.6 10*3/uL — ABNORMAL LOW (ref 4.0–10.5)

## 2017-08-13 LAB — BASIC METABOLIC PANEL
Anion gap: 8 (ref 5–15)
BUN: 10 mg/dL (ref 8–23)
CHLORIDE: 105 mmol/L (ref 98–111)
CO2: 22 mmol/L (ref 22–32)
Calcium: 7.8 mg/dL — ABNORMAL LOW (ref 8.9–10.3)
Creatinine, Ser: 0.89 mg/dL (ref 0.61–1.24)
GFR calc Af Amer: >60 mL/min (ref >60)
GFR calc non Af Amer: >60 mL/min (ref >60)
GLUCOSE: 127 mg/dL — AB (ref 70–99)
POTASSIUM: 3.8 mmol/L (ref 3.5–5.1)
Sodium: 135 mmol/L (ref 135–145)

## 2017-08-13 LAB — MAGNESIUM: Magnesium: 1.4 mg/dL — ABNORMAL LOW (ref 1.7–2.4)

## 2017-08-13 LAB — GLUCOSE, CAPILLARY: GLUCOSE-CAPILLARY: 115 mg/dL — AB (ref 70–99)

## 2017-08-13 MED ORDER — MAGNESIUM SULFATE 2 GM/50ML IV SOLN
2.0000 g | Freq: Once | INTRAVENOUS | Status: AC
Start: 2017-08-13 — End: 2017-08-13
  Administered 2017-08-13: 2 g via INTRAVENOUS
  Filled 2017-08-13: qty 50

## 2017-08-13 NOTE — Progress Notes (Signed)
Discharge and medication instructions reviewed with patient. Patient able to verbalize correctly administration of his insulin.  Questions answered and patient denies further questions. Prescriptions given to patient. Patient has texted a family member to come to drive him home. Lina SarBeth Chinwe Lope, RN

## 2017-08-13 NOTE — Discharge Summary (Signed)
Discharge Summary  George Bridges OMB:[id number] DOB: 1955-12-10  PCP: George Hams, FNP  Admit date: 08/07/2017 Discharge date: 08/13/2017  Time spent: 25 minutes  Discharge delayed to educate the patient on insulin and diabetes management. Diabetes coordinator consulted to assist.  Highly appreciated.  08/12/17: Patient seen and examined at his bedside.  He has no new complaints.  However he needs reinforcement on insulin education and management of his diabetes.  Diabetes coordinator following.  Highly appreciate recommendations.  08/13/2017: Patient seen and examined at his bedside.  He has no new complaints.  On the day of discharge patient was hemodynamically stable.  He will need to follow-up with his primary care provider within a week.  Recommendations for Outpatient Follow-up:  1. Follow-up with GI 2. Follow-up with PCP 3. Take your medications as prescribed 4. Start 2 g sodium diet  Recommendations from GI: ------------------------- - Continue Lasix 20 mg and Aldactone 25 mg.D/C octreotide.Continue twice a day PPI for 6 weeks followed by once a day PPI. -   2 g sodium diet. - patient declined use of beta blocker at this time because he has  ongoing weakness and fatigue which we'll get worse with starting beta blocker. - AMA mildly positive at 26.8, recommend repeat AMA  as an outpatient in 4 weeks. -  HBsAG and HCV AB negative.ASMA  Negative.  Normal Alpha 1 antitrypsin, Normal ceruloplasmin level. - recommend repeat EGD in 3-4 weeks for further band ligation. - okay to discharge home from GI standpoint. GI will sign off. Call us back if needed.   Discharge Diagnoses:  Active Hospital Problems   Diagnosis Date Noted  . GI bleed 08/07/2017  . Other cirrhosis of liver ([facility]) 08/07/2017  . DM type 2 (diabetes mellitus, type 2) ([facility]) 08/07/2017  . HTN (hypertension) 08/07/2017    Resolved Hospital Problems  No resolved problems to display.    Discharge  Condition: Stable  Diet recommendation: 2 g sodium diet Vitals:   08/12/17 2135 08/13/17 0439  BP: (!) 97/59 105/70  Pulse: (!) 59 60  Resp: 18 18  Temp: 98 F (36.7 C) 98.4 F (36.9 C)  SpO2: 97% 97%    History of present illness:  George Bridges is a 62 year old male with a past medical history significant for type 2 diabetes on glimepiride, depression, denies current or previous use of alcohol who presented to the ED due to emotional distress.  Found to be hypoglycemic which was corrected in the ED. Self reported melena with positive FOBT.  GI consulted.  EGD done on 08/08/2017 revealed large esophageal varices with 3 bands placed, diffuse hemorrhagic gastritis.  Superficial/linear gastric and duodenal bulb ulcers.  Newly diagnosed cirrhosis.  Post paracentesis on 08/09/2017 by interventional radiology for which 3.6 L of light yellow fluid was removed.  08/11/2017: Patient seen and examined at his bedside.  He has no new complaints.  Denies nausea or abdominal pain.  Denies recurrent GI bleed during this hospitalization.   Hospital Course:  Principal Problem:   GI bleed Active Problems:   Other cirrhosis of liver ([facility])   DM type 2 (diabetes mellitus, type 2) ([facility])   HTN (hypertension)   Newly diagnosed cirrhosis with unclear etiology EGD done on 08/08/2017 revealed large esophageal varices, diffuse hemorrhagic gastritis, superficial/linear gastric and duodenal bulb ulcers. Denies current or previous alcohol use Acute hepatitis viral panel negative Normal alpha-1 antitrypsin Normal ceruloplasmin level Completed octreotide drip Completed IV ceftriaxone Continue PPI twice daily Continue p.o. Spironolactone Continue Lasix  p.o. 20 mg daily Post paracentesis done on 08/09/2017  Type 2 diabetes now insulin-dependent Last A1c 11.4 on 08/07/2017 Repeat A1c to confirm Diabetes coordinator following Stop Lantus and NovoLog Continue 70/30 insulin 15 units twice daily-titrate if blood  sugar greater than 180 Continue insulin education for patient safety Continue regular Accu-Cheks Stop glimepiride and metformin  Upper GI bleed, resolved Hemoglobin is stable Repeat EGD recommended in 3 to 4 weeks for further banding and ligation  Acute blood loss anemia Management as stated above  Severe protein calorie deficiency Albumin 2.5 Most likely secondary to advanced liver disease  Chronic depression Continue fluoxetine    Procedures: EGD Paracentesis  Consultations:  GI  Discharge Exam: BP 105/70 (BP Location: Right Arm)   Pulse 60   Temp 98.4 F (36.9 C) (Oral)   Resp 18   Ht '5\' 6"'  (1.676 m)   Wt 95.7 kg (211 lb)   SpO2 97%   BMI 34.06 kg/m  . General: 62 y.o. year-old male obese in no acute distress.  Alert and oriented x3. . Cardiovascular: Regular rate and rhythm with no rubs or gallops.  No thyromegaly or JVD noted. Marland Kitchen Respiratory: Clear to auscultation with no wheezes or rales. Good inspiratory effort. . Abdomen: Soft nontender nondistended with normal bowel sounds x4 quadrants. . Musculoskeletal: No lower extremity edema. 2/4 pulses in all 4 extremities. . Skin: No ulcerative lesions noted or rashes, . Psychiatry: Mood is appropriate for condition and setting  Discharge Instructions You were cared for by a hospitalist during your hospital stay. If you have any questions about your discharge medications or the care you received while you were in the hospital after you are discharged, you can call the unit and asked to speak with the hospitalist on call if the hospitalist that took care of you is not available. Once you are discharged, your primary care physician will handle any further medical issues. Please note that NO REFILLS for any discharge medications will be authorized once you are discharged, as it is imperative that you return to your primary care physician (or establish a relationship with a primary care physician if you do not have  one) for your aftercare needs so that they can reassess your need for medications and monitor your lab values.   Allergies as of 08/13/2017      Reactions   Oxycodone Hives      Medication List    STOP taking these medications   glimepiride 2 MG tablet Commonly known as:  AMARYL   losartan 25 MG tablet Commonly known as:  COZAAR   metFORMIN 500 MG tablet Commonly known as:  GLUCOPHAGE     TAKE these medications   aspirin EC 81 MG tablet Take 81 mg by mouth daily.   FLUoxetine 20 MG tablet Commonly known as:  PROZAC Take 1 tablet (20 mg total) by mouth daily.   furosemide 20 MG tablet Commonly known as:  LASIX Take 1 tablet (20 mg total) by mouth daily.   gabapentin 400 MG capsule Commonly known as:  NEURONTIN Take 400 mg by mouth 3 (three) times daily as needed (pain).   insulin aspart protamine- aspart (70-30) 100 UNIT/ML injection Commonly known as:  NOVOLOG MIX 70/30 Inject 0.15 mLs (15 Units total) into the skin 2 (two) times daily with a meal.   pantoprazole 40 MG tablet Commonly known as:  PROTONIX Take 1 tablet (40 mg total) by mouth 2 (two) times daily.   spironolactone 25 MG tablet Commonly  known as:  ALDACTONE Take 1 tablet (25 mg total) by mouth daily.     ASK your doctor about these medications   insulin starter kit- pen needles Misc 1 kit by Other route once for 1 dose. Ask about: Should I take this medication?      Allergies  Allergen Reactions  . Oxycodone Hives   Follow-up Information    Brahmbhatt, Parag, MD. Schedule an appointment as soon as possible for a visit in 4 week(s).   Specialty:  Gastroenterology Why:  newly diagnosed cirrhosis. Follow-up in 3-4 weeks. Contact information: [address] [place] [address] [phone]        George Hams, FNP. Call in 1 day(s).   Specialty:  Family Medicine Why:  Please call for an appointment. Contact information: [place]  [address] [phone]            The results of significant diagnostics from this hospitalization (including imaging, microbiology, ancillary and laboratory) are listed below for reference.    Significant Diagnostic Studies: Ct Abdomen Pelvis W Contrast  Result Date: 08/07/2017 CLINICAL DATA:  Emotionally distraught.  Depressed.  Melena. EXAM: CT ABDOMEN AND PELVIS WITH CONTRAST TECHNIQUE: Multidetector CT imaging of the abdomen and pelvis was performed using the standard protocol following bolus administration of intravenous contrast. CONTRAST:  116m ISOVUE-300 IOPAMIDOL (ISOVUE-300) INJECTION 61% COMPARISON:  None. FINDINGS: Lower chest: Lung bases are clear. Hepatobiliary: Probable hepatic cirrhosis with enlarged left lateral segment and caudate lobes. No focal liver lesions. Gallbladder and bile ducts are unremarkable. Pancreas: Unremarkable. No pancreatic ductal dilatation or surrounding inflammatory changes. Spleen: Normal in size without focal abnormality. Adrenals/Urinary Tract: Adrenal glands are unremarkable. Kidneys are normal, without renal calculi, focal lesion, or hydronephrosis. Bladder is unremarkable. Stomach/Bowel: Stomach, small bowel, and colon are not abnormally distended. No wall thickening or inflammatory changes appreciated. Appendix is not identified. Vascular/Lymphatic: Scattered calcification of the abdominal aorta. No aneurysm. Retroperitoneal lymph nodes are not pathologically enlarged. Reproductive: Prostate gland is enlarged, measuring 5.3 cm diameter. Other: Diffuse free fluid throughout the abdomen and pelvis consistent with moderate ascites. No free air. Abdominal wall musculature appears intact. Musculoskeletal: No acute or significant osseous findings. IMPRESSION: 1. Configuration of the liver is consistent with hepatic cirrhosis. 2. Diffuse abdominal and pelvic ascites. 3. Enlarged prostate gland. 4. Aortic atherosclerosis. Electronically Signed   By: WLucienne CapersM.D.   On: 08/07/2017 05:58   [place]acent[place]  Result Date: 08/09/2017 INDICATION: Patient with history of cirrhosis by imaging, esophageal varices, ascites. Request made for diagnostic and therapeutic paracentesis up to 5 liters. EXAM: ULTRASOUND GUIDED DIAGNOSTIC AND THERAPEUTIC PARACENTESIS MEDICATIONS: None COMPLICATIONS: None immediate. PROCEDURE: Informed written consent was obtained from the patient after a discussion of the risks, benefits and alternatives to treatment. A timeout was performed prior to the initiation of the procedure. Initial ultrasound scanning demonstrates a large amount of ascites within the left lower abdominal quadrant. The left lower abdomen was prepped and draped in the usual sterile fashion. 1% lidocaine was used for local anesthesia. Following this, a 19 gauge, 7-cm, Yueh catheter was introduced. An ultrasound image was saved for documentation purposes. The paracentesis was performed. The catheter was removed and a dressing was applied. The patient tolerated the procedure well without immediate post procedural complication. FINDINGS: A total of approximately 3.6 liters of light yellow fluid was removed. Samples were sent to the laboratory as requested by the clinical team. IMPRESSION: Successful ultrasound-guided diagnostic and  therapeutic paracentesis yielding 3.6 liters of peritoneal fluid. Read by: Rowe Robert, PA-C Electronically Signed   By: Lucrezia Europe M.D.   On: 08/09/2017 14:55    Microbiology: Recent Results (from the past 240 hour(s))  MRSA PCR Screening     Status: None   Collection Time: 08/07/17  8:58 AM  Result Value Ref Range Status   MRSA by PCR NEGATIVE NEGATIVE Final    Comment:        The GeneXpert MRSA Assay (FDA approved for NASAL specimens only), is one component of a comprehensive MRSA colonization surveillance program. It is not intended to diagnose MRSA infection nor to guide or monitor treatment for MRSA infections. Performed  at [facility], [place] [address], [place], [place] [address]   Culture, body fluid-bottle     Status: None (Preliminary result)   Collection Time: 08/09/17  2:30 PM  Result Value Ref Range Status   Specimen Description FLUID PERITONEAL  Final   Special Requests BOTTLES DRAWN AEROBIC AND ANAEROBIC  Final   Culture   Final    NO GROWTH 4 DAYS Performed at [facility], [place] [address], [place], [place] [address]    Report Status PENDING  Incomplete  Gram stain     Status: None   Collection Time: 08/09/17  2:30 PM  Result Value Ref Range Status   Specimen Description FLUID PERITONEAL  Final   Special Requests NONE  Final   Gram Stain   Final    WBC PRESENT, PREDOMINANTLY MONONUCLEAR NO ORGANISMS SEEN CYTOSPIN SMEAR Performed at [facility], [place] [address], [place], [place] [address]    Report Status 08/09/2017 FINAL  Final     Labs: Basic Metabolic Panel: Recent Labs  Lab 08/08/17 0337 08/09/17 0334 08/11/17 0410 08/12/17 0423 08/13/17 0417  NA 134* 134* 134* 134* 135  K 4.5 4.3 4.0 4.0 3.8  CL 104 108 108 104 105  CO2 24 22 20* 24 22  GLUCOSE 235* 223* 175* 228* 127*  BUN 40* 25* '11 9 10  ' CREATININE 1.16 1.01 0.86 0.83 0.89  CALCIUM 7.7* 7.4* 7.4* 7.7* 7.8*  MG  --   --   --   --  1.4*   Liver Function Tests: Recent Labs  Lab 08/07/17 0403 08/08/17 0337 08/09/17 0334 08/11/17 0410 08/12/17 0423  AST 50* 98* 91* 44* 35  ALT 45 64* 65* 50* 44  ALKPHOS 57 52 58 54 57  BILITOT 1.4* 1.7* 1.1 0.9 1.2  PROT 6.0* 5.6* 5.5* 5.3* 5.6*  ALBUMIN 2.8* 2.6* 2.5* 2.3* 2.5*   No results for input(s): LIPASE, AMYLASE in the last 168 hours. No results for input(s): AMMONIA in the last 168 hours. CBC: Recent Labs  Lab 08/07/17 0403  08/09/17 0334 08/10/17 0423 08/11/17 0410 08/12/17 0423 08/13/17 0417  WBC 8.0   < > 4.3 3.6* 3.7* 3.4* 3.6*  NEUTROABS 4.6  --   --   --   --   --   --   HGB 8.7*   < > 8.7* 8.5* 9.0* 9.1* 9.5*  HCT 25.5*    < > 26.0* 25.8* 26.6* 27.3* 27.8*  MCV 95.5   < > 97.0 96.3 97.8 96.5 96.2  PLT 97*   < > 83* 70* 70* 74* 80*   < > = values in this interval not displayed.   Cardiac Enzymes: No results for input(s): CKTOTAL, CKMB, CKMBINDEX, TROPONINI in the last 168 hours. BNP: BNP (last 3  results) No results for input(s): BNP in the last 8760 hours.  ProBNP (last 3 results) No results for input(s): PROBNP in the last 8760 hours.  CBG: Recent Labs  Lab 08/12/17 1132 08/12/17 1646 08/12/17 1803 08/12/17 2133 08/13/17 0726  GLUCAP 289* 79 164* 123* 115*       Signed:  Kayleen Memos, MD [facility] 08/13/2017, 11:03 AM

## 2017-08-14 LAB — CULTURE, BODY FLUID W GRAM STAIN -BOTTLE: Culture: NO GROWTH

## 2017-09-13 ENCOUNTER — Other Ambulatory Visit: Payer: Self-pay | Admitting: Gastroenterology

## 2017-09-29 ENCOUNTER — Other Ambulatory Visit: Payer: Self-pay

## 2017-09-29 ENCOUNTER — Emergency Department (HOSPITAL_COMMUNITY): Payer: Medicare Other

## 2017-09-29 ENCOUNTER — Emergency Department (HOSPITAL_COMMUNITY)
Admission: EM | Admit: 2017-09-29 | Discharge: 2017-09-29 | Disposition: A | Discharge status: Home / Self Care | Payer: Medicare Other | Attending: Emergency Medicine | Admitting: Emergency Medicine

## 2017-09-29 ENCOUNTER — Encounter (HOSPITAL_COMMUNITY): Payer: Self-pay

## 2017-09-29 DIAGNOSIS — Z794 Long term (current) use of insulin: Secondary | ICD-10-CM | POA: Insufficient documentation

## 2017-09-29 DIAGNOSIS — Y9389 Activity, other specified: Secondary | ICD-10-CM | POA: Diagnosis not present

## 2017-09-29 DIAGNOSIS — E119 Type 2 diabetes mellitus without complications: Secondary | ICD-10-CM | POA: Diagnosis not present

## 2017-09-29 DIAGNOSIS — Y999 Unspecified external cause status: Secondary | ICD-10-CM | POA: Diagnosis not present

## 2017-09-29 DIAGNOSIS — I10 Essential (primary) hypertension: Secondary | ICD-10-CM | POA: Diagnosis not present

## 2017-09-29 DIAGNOSIS — Y92410 Unspecified street and highway as the place of occurrence of the external cause: Secondary | ICD-10-CM | POA: Insufficient documentation

## 2017-09-29 DIAGNOSIS — Z79899 Other long term (current) drug therapy: Secondary | ICD-10-CM | POA: Diagnosis not present

## 2017-09-29 DIAGNOSIS — S0990XA Unspecified injury of head, initial encounter: Secondary | ICD-10-CM | POA: Diagnosis present

## 2017-09-29 DIAGNOSIS — S161XXA Strain of muscle, fascia and tendon at neck level, initial encounter: Secondary | ICD-10-CM | POA: Insufficient documentation

## 2017-09-29 HISTORY — DX: Unspecified osteoarthritis, unspecified site: M19.90

## 2017-09-29 MED ORDER — METHOCARBAMOL 500 MG PO TABS: 500.0000 mg | ORAL_TABLET | Freq: Two times a day (BID) | ORAL | 0 refills | Status: AC
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
## 2017-10-20 ENCOUNTER — Ambulatory Visit (HOSPITAL_COMMUNITY)
Admission: RE | Admit: 2017-10-20 | Discharge: 2017-10-20 | Disposition: A | Discharge status: Home / Self Care | Payer: Medicare Other | Source: Ambulatory Visit | Attending: Gastroenterology | Admitting: Gastroenterology

## 2017-10-20 ENCOUNTER — Ambulatory Visit (HOSPITAL_COMMUNITY): Payer: Medicare Other | Admitting: Anesthesiology

## 2017-10-20 ENCOUNTER — Other Ambulatory Visit: Payer: Self-pay

## 2017-10-20 ENCOUNTER — Encounter (HOSPITAL_COMMUNITY)
Admission: RE | Disposition: A | Discharge status: Home / Self Care | Payer: Self-pay | Source: Ambulatory Visit | Attending: Gastroenterology

## 2017-10-20 ENCOUNTER — Encounter (HOSPITAL_COMMUNITY): Payer: Self-pay | Admitting: *Deleted

## 2017-10-20 DIAGNOSIS — E119 Type 2 diabetes mellitus without complications: Secondary | ICD-10-CM | POA: Diagnosis not present

## 2017-10-20 DIAGNOSIS — G4730 Sleep apnea, unspecified: Secondary | ICD-10-CM | POA: Insufficient documentation

## 2017-10-20 DIAGNOSIS — K3189 Other diseases of stomach and duodenum: Secondary | ICD-10-CM | POA: Insufficient documentation

## 2017-10-20 DIAGNOSIS — D649 Anemia, unspecified: Secondary | ICD-10-CM | POA: Insufficient documentation

## 2017-10-20 DIAGNOSIS — Z794 Long term (current) use of insulin: Secondary | ICD-10-CM | POA: Insufficient documentation

## 2017-10-20 DIAGNOSIS — Z8249 Family history of ischemic heart disease and other diseases of the circulatory system: Secondary | ICD-10-CM | POA: Diagnosis not present

## 2017-10-20 DIAGNOSIS — M1990 Unspecified osteoarthritis, unspecified site: Secondary | ICD-10-CM | POA: Diagnosis not present

## 2017-10-20 DIAGNOSIS — M109 Gout, unspecified: Secondary | ICD-10-CM | POA: Diagnosis not present

## 2017-10-20 DIAGNOSIS — K7460 Unspecified cirrhosis of liver: Secondary | ICD-10-CM | POA: Insufficient documentation

## 2017-10-20 DIAGNOSIS — K766 Portal hypertension: Secondary | ICD-10-CM | POA: Diagnosis not present

## 2017-10-20 DIAGNOSIS — I10 Essential (primary) hypertension: Secondary | ICD-10-CM | POA: Insufficient documentation

## 2017-10-20 DIAGNOSIS — I8500 Esophageal varices without bleeding: Secondary | ICD-10-CM | POA: Insufficient documentation

## 2017-10-20 DIAGNOSIS — Z79899 Other long term (current) drug therapy: Secondary | ICD-10-CM | POA: Diagnosis not present

## 2017-10-20 DIAGNOSIS — Z885 Allergy status to narcotic agent status: Secondary | ICD-10-CM | POA: Insufficient documentation

## 2017-10-20 DIAGNOSIS — Z833 Family history of diabetes mellitus: Secondary | ICD-10-CM | POA: Diagnosis not present

## 2017-10-20 HISTORY — PX: ESOPHAGEAL BANDING: SHX5518

## 2017-10-20 HISTORY — PX: ESOPHAGOGASTRODUODENOSCOPY (EGD) WITH PROPOFOL: SHX5813

## 2017-10-20 LAB — GLUCOSE, CAPILLARY: GLUCOSE-CAPILLARY: 178 mg/dL — AB (ref 70–99)

## 2017-10-20 SURGERY — ESOPHAGOGASTRODUODENOSCOPY (EGD) WITH PROPOFOL
Anesthesia: Monitor Anesthesia Care

## 2017-10-20 MED ORDER — PROPOFOL 10 MG/ML IV BOLUS
INTRAVENOUS | Status: DC | PRN
  Administered 2017-10-20: 20 mg via INTRAVENOUS
  Administered 2017-10-20: 30 mg via INTRAVENOUS

## 2017-10-20 MED ORDER — PROPRANOLOL HCL 10 MG PO TABS: 10.0000 mg | ORAL_TABLET | Freq: Two times a day (BID) | ORAL | 1 refills | Status: DC

## 2017-10-20 MED ORDER — SODIUM CHLORIDE 0.9 % IV SOLN: INTRAVENOUS | Status: DC

## 2017-10-20 MED ORDER — PROPOFOL 500 MG/50ML IV EMUL
INTRAVENOUS | Status: DC | PRN
  Administered 2017-10-20: 125 ug/kg/min via INTRAVENOUS

## 2017-10-20 MED ORDER — FUROSEMIDE 20 MG PO TABS: 20.0000 mg | ORAL_TABLET | Freq: Every day | ORAL | 0 refills | Status: AC

## 2017-10-20 MED ORDER — LACTATED RINGERS IV SOLN
INTRAVENOUS | Status: DC
  Administered 2017-10-20: 08:00:00 via INTRAVENOUS

## 2017-10-20 MED ORDER — PROPOFOL 10 MG/ML IV BOLUS
INTRAVENOUS | Status: AC
  Filled 2017-10-20: qty 60

## 2017-10-20 MED ORDER — PROPRANOLOL HCL 10 MG PO TABS: 20.0000 mg | ORAL_TABLET | Freq: Two times a day (BID) | ORAL | 1 refills | Status: AC

## 2017-10-20 SURGICAL SUPPLY — 15 items

## 2017-10-20 NOTE — Op Note (Signed)
[facility] Patient Name: George Bridges Procedure Date: 10/20/2017 MRN: [id number] Attending MD: Kathi Der , MD Date of Birth: [birth year] CSN: [id number] Age: 62 Admit Type: Outpatient Procedure:                Upper GI endoscopy Indications:              Esophageal varices, For therapy of esophageal                            varices Providers:                Kathi Der, MD, Clearnce Sorrel, RN, Harrington Challenger, Technician, Albertina Senegal. Alday CRNA, CRNA Referring MD:              Medicines:                Sedation Administered by an Anesthesia Professional Complications:            No immediate complications. Estimated Blood Loss:     Estimated blood loss: none. Procedure:                Pre-Anesthesia Assessment:                           - Prior to the procedure, a History and Physical                            was performed, and patient medications and                            allergies were reviewed. The patient's tolerance of                            previous anesthesia was also reviewed. The risks                            and benefits of the procedure and the sedation                            options and risks were discussed with the patient.                            All questions were answered, and informed consent                            was obtained. Prior Anticoagulants: The patient has                            taken no previous anticoagulant or antiplatelet                            agents. ASA Grade Assessment: III - A patient with  severe systemic disease. After reviewing the risks                            and benefits, the patient was deemed in                            satisfactory condition to undergo the procedure.                           After obtaining informed consent, the endoscope was                            passed under direct vision. Throughout the              procedure, the patient's blood pressure, pulse, and                            oxygen saturations were monitored continuously. The                            GIF-H190 ([id number]) Olympus adult endoscope was                            introduced through the mouth, and advanced to the                            second part of duodenum. The upper GI endoscopy was                            technically difficult and complex due to presence                            of food. The patient tolerated the procedure well. Scope In: Scope Out: Findings:      Three columns of non-bleeding large (> 5 mm) varices were found in the       distal esophagus,. No stigmata of recent bleeding were evident and no       red wale signs were present. The varices appeared unchanged in size from       prior exam. Four bands were successfully placed with incomplete       eradication of varices. There was no bleeding during, and at the end, of       the procedure.      Severe portal hypertensive gastropathy was found in the entire examined       stomach.      A large amount of food (residue) was found in the gastric fundus and in       the gastric body.      The duodenal bulb, first portion of the duodenum and second portion of       the duodenum were normal. Impression:               - Non-bleeding large (> 5 mm) esophageal varices.                            Incompletely eradicated. Banded.                           -  Portal hypertensive gastropathy.                           - A large amount of food (residue) in the stomach.                           - Normal duodenal bulb, first portion of the                            duodenum and second portion of the duodenum.                           - No specimens collected. Moderate Sedation:      Moderate (conscious) sedation was personally administered by an       anesthesia professional. The following parameters were monitored: oxygen       saturation,  heart rate, blood pressure, and response to care. Recommendation:           - Patient has a contact number available for                            emergencies. The signs and symptoms of potential                            delayed complications were discussed with the                            patient. Return to normal activities tomorrow.                            Written discharge instructions were provided to the                            patient.                           - Resume previous diet.                           - Continue present medications.                           - Repeat upper endoscopy in 4 weeks for retreatment.                           - Return to my office as previously scheduled. Procedure Code(s):        --- Professional ---                           [id number], Esophagogastroduodenoscopy, flexible,                            transoral; with band ligation of esophageal/gastric                            varices Diagnosis Code(s):        ---  Professional ---                           I85.00, Esophageal varices without bleeding                           K76.6, Portal hypertension                           K31.89, Other diseases of stomach and duodenum CPT copyright 2017 American Medical Association. All rights reserved. The codes documented in this report are preliminary and upon coder review may  be revised to meet current compliance requirements. Kathi Der, MD Kathi Der, MD 10/20/2017 8:55:43 AM Number of Addenda: 0

## 2017-10-20 NOTE — H&P (Signed)
Primary Care Physician:  Maudie Flakes, FNP Primary Gastroenterologist:  Dr. Levora Angel  Reason for Visit : EGD for band ligation  HPI: George Bridges is a 62 y.o. male with past medical history of unspecified cirrhosis here for EGD for band ligation. He initially presented to the hospital in June 2019 with melena. EGD on June 24 showed large varices 3 bands were placed. Biopsies were negative for H. Pylori. He has any GI symptoms today.  Blood work on 09/12/2017 showed INR of 1.3, platelet count of 75 currently on propranolol 10 mg once a day.He declined twice a day propranolol because of fatigue and weakness.  Past Medical History:  Diagnosis Date  . Arthritis   . Diabetes mellitus without complication (HCC)   . Gout   . Sleep apnea     Past Surgical History:  Procedure Laterality Date  . BIOPSY  08/08/2017   Procedure: BIOPSY;  Surgeon: Kathi Der, MD;  Location: [facility];  Service: Gastroenterology;;  . ESOPHAGEAL BANDING  08/08/2017   Procedure: ESOPHAGEAL BANDING;  Surgeon: Kathi Der, MD;  Location: [facility];  Service: Gastroenterology;;  . ESOPHAGOGASTRODUODENOSCOPY (EGD) WITH PROPOFOL N/A 08/08/2017   Procedure: ESOPHAGOGASTRODUODENOSCOPY (EGD) WITH PROPOFOL;  Surgeon: Kathi Der, MD;  Location: [facility];  Service: Gastroenterology;  Laterality: N/A;  possible banding  . KNEE ARTHROSCOPY Left   . ROTATOR CUFF REPAIR Bilateral     Prior to Admission medications   Medication Sig Start Date End Date Taking? Authorizing Provider  gabapentin (NEURONTIN) 400 MG capsule Take 400 mg by mouth 3 (three) times daily.    Yes [provider]  metFORMIN (GLUCOPHAGE) 500 MG tablet Take 1,000 mg by mouth 2 (two) times daily with a meal. 09/15/17  Yes [provider]  methocarbamol (ROBAXIN) 500 MG tablet Take 1 tablet (500 mg total) by mouth 2 (two) times daily. 09/29/17  Yes Khatri, Hina, PA-C  pantoprazole (PROTONIX) 40 MG tablet Take 1  tablet (40 mg total) by mouth 2 (two) times daily. 08/11/17  Yes Darlin Drop, DO  propranolol (INDERAL) 10 MG tablet Take 10 mg by mouth 2 (two) times daily. Take on an empty stomach 09/12/17  Yes [provider]  spironolactone (ALDACTONE) 50 MG tablet Take 50 mg by mouth daily. 09/15/17  Yes [provider]  ursodiol (ACTIGALL) 300 MG capsule Take 600 mg by mouth 2 (two) times daily. 09/19/17  Yes [provider]  furosemide (LASIX) 20 MG tablet Take 1 tablet (20 mg total) by mouth daily. Patient not taking: Reported on 09/29/2017 08/12/17   Darlin Drop, DO  insulin aspart protamine- aspart (NOVOLOG MIX 70/30) (70-30) 100 UNIT/ML injection Inject 0.15 mLs (15 Units total) into the skin 2 (two) times daily with a meal. Patient not taking: Reported on 09/29/2017 08/12/17   Darlin Drop, DO  spironolactone (ALDACTONE) 25 MG tablet Take 1 tablet (25 mg total) by mouth daily. Patient not taking: Reported on 09/29/2017 08/12/17   Darlin Drop, DO    Scheduled Meds: Continuous Infusions: . lactated ringers 20 mL/hr at 10/20/17 0752   PRN Meds:.  Allergies as of 09/13/2017 - Review Complete 08/08/2017  Allergen Reaction Noted  . Oxycodone Hives 08/07/2017    Family History  Problem Relation Age of Onset  . Diabetes Father   . CAD Brother     Social History   Socioeconomic History  . Marital status: Divorced    Spouse name: Not on file  . Number of children: Not on  file  . Years of education: Not on file  . Highest education level: Not on file  Occupational History  . Not on file  Social Needs  . Financial resource strain: Not very hard  . Food insecurity:    Worry: Never true    Inability: Never true  . Transportation needs:    Medical: No    Non-medical: No  Tobacco Use  . Smoking status: Never Smoker  . Smokeless tobacco: Never Used  Substance and Sexual Activity  . Alcohol use: No  . Drug use: No  . Sexual activity: Not Currently     Partners: Female  Lifestyle  . Physical activity:    Days per week: Not on file    Minutes per session: Not on file  . Stress: Not on file  Relationships  . Social connections:    Talks on phone: Not on file    Gets together: Not on file    Attends religious service: Not on file    Active member of club or organization: Not on file    Attends meetings of clubs or organizations: Not on file    Relationship status: Not on file  . Intimate partner violence:    Fear of current or ex partner: No    Emotionally abused: No    Physically abused: No    Forced sexual activity: No  Other Topics Concern  . Not on file  Social History Narrative  . Not on file    Review of Systems: All negative except as stated above in HPI.  Physical Exam: Vital signs: Vitals:   10/20/17 0745  BP: 113/61  Resp: 16  Temp: 98.2 F (36.8 C)  SpO2: 100%     General:   Alert,  Well-developed, well-nourished, pleasant and cooperative in NAD Lungs:  Clear throughout to auscultation.   No wheezes, crackles, or rhonchi. No acute distress. Heart:  Regular rate and rhythm; no murmurs, clicks, rubs,  or gallops. Abdomen: soft, nontender, nondistended, bowel sounds present. Rectal:  Deferred  GI:  Lab Results: No results for input(s): WBC, HGB, HCT, PLT in the last 72 hours. BMET No results for input(s): NA, K, CL, CO2, GLUCOSE, BUN, CREATININE, CALCIUM in the last 72 hours. LFT No results for input(s): PROT, ALBUMIN, AST, ALT, ALKPHOS, BILITOT, BILIDIR, IBILI in the last 72 hours. PT/INR No results for input(s): LABPROT, INR in the last 72 hours.   Studies/Results: No results found.  Impression/Plan: - large esophageal varices - Cirrhosis of the liver  Recommendations -------------------------- - Proceed with EGD with possible band ligation.  Risks (bleeding, infection, bowel perforation that could require surgery, sedation-related changes in cardiopulmonary systems), benefits (identification  and possible treatment of source of symptoms, exclusion of certain causes of symptoms), and alternatives (watchful waiting, radiographic imaging studies, empiric medical treatment)  were explained to patient in detail and patient wishes to proceed.    LOS: 0 days   Kathi Der  MD, FACP 10/20/2017, 8:32 AM  Contact #  [phone]

## 2017-10-20 NOTE — Anesthesia Preprocedure Evaluation (Signed)
Anesthesia Evaluation  Patient identified by MRN, date of birth, ID band Patient awake    Reviewed: Allergy & Precautions, NPO status , Patient's Chart, lab work & pertinent test results  Airway Mallampati: II  TM Distance: >3 FB Neck ROM: Full    Dental  (+) Dental Advisory Given   Pulmonary neg pulmonary ROS, sleep apnea ,    Pulmonary exam normal breath sounds clear to auscultation       Cardiovascular hypertension, Pt. on medications Normal cardiovascular exam Rhythm:Regular Rate:Normal     Neuro/Psych negative neurological ROS  negative psych ROS   GI/Hepatic negative GI ROS, Neg liver ROS,   Endo/Other  diabetes, Type 2, Oral Hypoglycemic Agents  Renal/GU negative Renal ROS     Musculoskeletal  (+) Arthritis ,   Abdominal   Peds  Hematology negative hematology ROS (+) anemia ,   Anesthesia Other Findings   Reproductive/Obstetrics negative OB ROS                            Anesthesia Physical  Anesthesia Plan  ASA: III  Anesthesia Plan: MAC   Post-op Pain Management:    Induction: Intravenous  PONV Risk Score and Plan: 1 and Treatment may vary due to age or medical condition and Propofol infusion  Airway Management Planned: Simple Face Mask  Additional Equipment:   Intra-op Plan:   Post-operative Plan:   Informed Consent: I have reviewed the patients History and Physical, chart, labs and discussed the procedure including the risks, benefits and alternatives for the proposed anesthesia with the patient or authorized representative who has indicated his/her understanding and acceptance.   Dental advisory given  Plan Discussed with: CRNA  Anesthesia Plan Comments:        Anesthesia Quick Evaluation

## 2017-10-20 NOTE — Anesthesia Postprocedure Evaluation (Signed)
Anesthesia Post Note  Patient: George Bridges  Procedure(s) Performed: ESOPHAGOGASTRODUODENOSCOPY (EGD) WITH PROPOFOL (N/A ) ESOPHAGEAL BANDING (N/A )     Patient location during evaluation: PACU Anesthesia Type: MAC Level of consciousness: awake and alert Pain management: pain level controlled Vital Signs Assessment: post-procedure vital signs reviewed and stable Respiratory status: spontaneous breathing Cardiovascular status: stable Anesthetic complications: no    Last Vitals:  Vitals:   10/20/17 0900 10/20/17 0910  BP: 114/65 103/60  Pulse: 66 64  Resp: 19 16  Temp:    SpO2: 100% 100%    Last Pain:  Vitals:   10/20/17 0910  TempSrc:   PainSc: 0-No pain                 Nolon Nations

## 2017-10-20 NOTE — Discharge Instructions (Signed)

## 2017-10-20 NOTE — Transfer of Care (Signed)
Immediate Anesthesia Transfer of Care Note  Patient: George Bridges  Procedure(s) Performed: ESOPHAGOGASTRODUODENOSCOPY (EGD) WITH PROPOFOL (N/A ) ESOPHAGEAL BANDING (N/A )  Patient Location: PACU  Anesthesia Type:MAC  Level of Consciousness: sedated  Airway & Oxygen Therapy: Patient Spontanous Breathing and Patient connected to nasal cannula oxygen  Post-op Assessment: Report given to RN and Post -op Vital signs reviewed and stable  Post vital signs: Reviewed and stable  Last Vitals:  Vitals Value Taken Time  BP    Temp    Pulse    Resp    SpO2      Last Pain:  Vitals:   10/20/17 0745  TempSrc: Oral         Complications: No apparent anesthesia complications

## 2017-10-21 ENCOUNTER — Encounter (HOSPITAL_COMMUNITY): Payer: Self-pay | Admitting: Gastroenterology

## 2017-11-16 ENCOUNTER — Other Ambulatory Visit: Payer: Self-pay | Admitting: Gastroenterology

## 2017-12-05 NOTE — Progress Notes (Signed)
Patient called to cancel procedure for 10/22 due to no ride availability.  Dr. Rich Brave office notified.  Roselie Awkward, RN

## 2017-12-06 ENCOUNTER — Encounter (HOSPITAL_COMMUNITY): Payer: Self-pay

## 2017-12-06 ENCOUNTER — Ambulatory Visit (HOSPITAL_COMMUNITY): Admit: 2017-12-06 | Payer: Medicare Other | Admitting: Gastroenterology

## 2017-12-06 SURGERY — ESOPHAGOGASTRODUODENOSCOPY (EGD) WITH PROPOFOL
Anesthesia: Monitor Anesthesia Care

## 2018-06-01 ENCOUNTER — Other Ambulatory Visit: Payer: Self-pay | Admitting: Gastroenterology

## 2018-06-01 DIAGNOSIS — K7469 Other cirrhosis of liver: Secondary | ICD-10-CM

## 2018-07-21 ENCOUNTER — Ambulatory Visit
Admission: RE | Admit: 2018-07-21 | Discharge: 2018-07-21 | Disposition: A | Discharge status: Home / Self Care | Payer: Medicare Other | Source: Ambulatory Visit | Attending: Gastroenterology | Admitting: Gastroenterology

## 2018-07-21 DIAGNOSIS — K7469 Other cirrhosis of liver: Secondary | ICD-10-CM

## 2018-10-31 ENCOUNTER — Other Ambulatory Visit: Payer: Self-pay | Admitting: Gastroenterology

## 2018-11-29 NOTE — Progress Notes (Signed)
Pt did not show up for covid test. Dr. Mardene Celeste office called and made aware and they contacted patient to reschedule.

## 2018-11-30 ENCOUNTER — Encounter (HOSPITAL_COMMUNITY): Admission: RE | Payer: Self-pay | Source: Home / Self Care

## 2018-11-30 ENCOUNTER — Ambulatory Visit (HOSPITAL_COMMUNITY): Admission: RE | Admit: 2018-11-30 | Payer: Medicare Other | Source: Home / Self Care | Admitting: Gastroenterology

## 2018-11-30 SURGERY — ESOPHAGOGASTRODUODENOSCOPY (EGD) WITH PROPOFOL
Anesthesia: Monitor Anesthesia Care
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
## 2020-10-18 IMAGING — US ULTRASOUND ABDOMEN LIMITED
1 series · 14 of 25 positions shown · non-contrast
Comparison: CT 08/07/2017

CLINICAL DATA: Cirrhosis

EXAM:
ULTRASOUND ABDOMEN LIMITED RIGHT UPPER QUADRANT

[Series 1: ultrasound abdomen limited · 0.23mm/px · 14 of 48 slices shown]
[im 1/48]
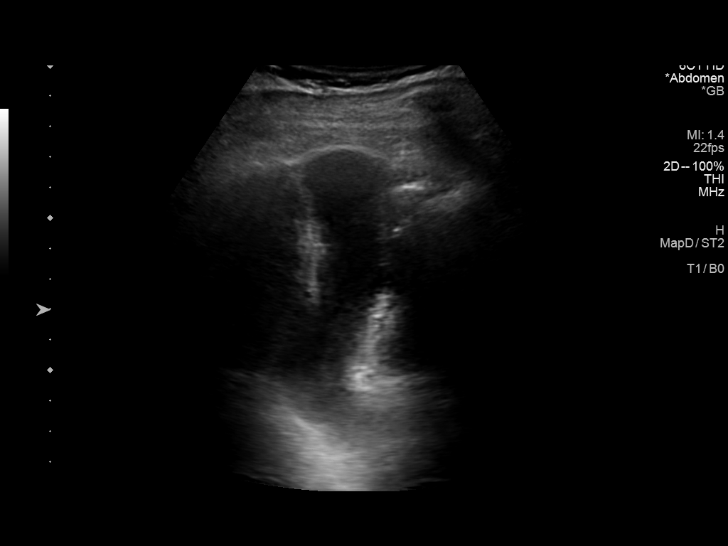
[im 4/48]
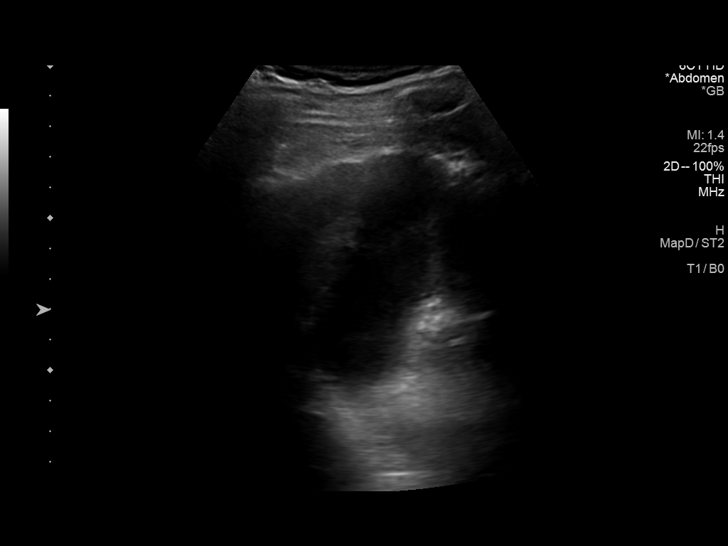
[im 8/48]
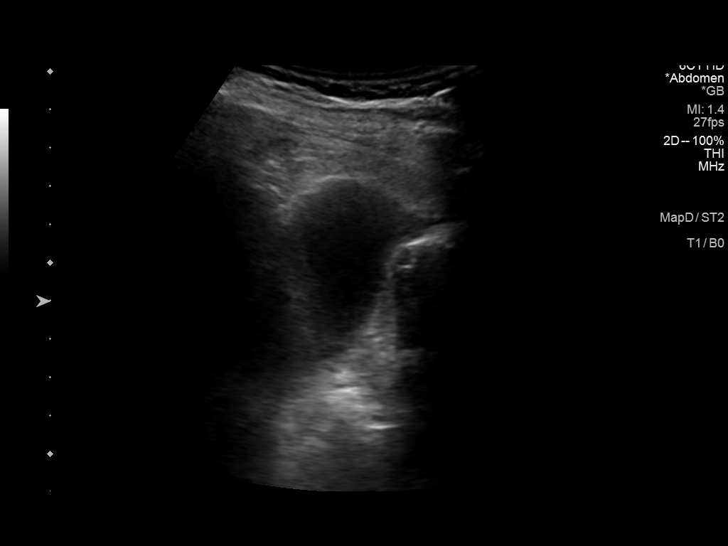
[im 12/48]
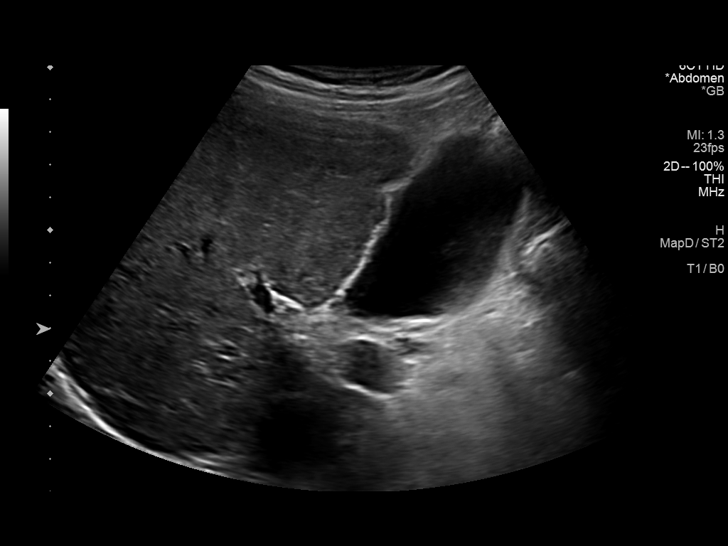
[im 16/48]
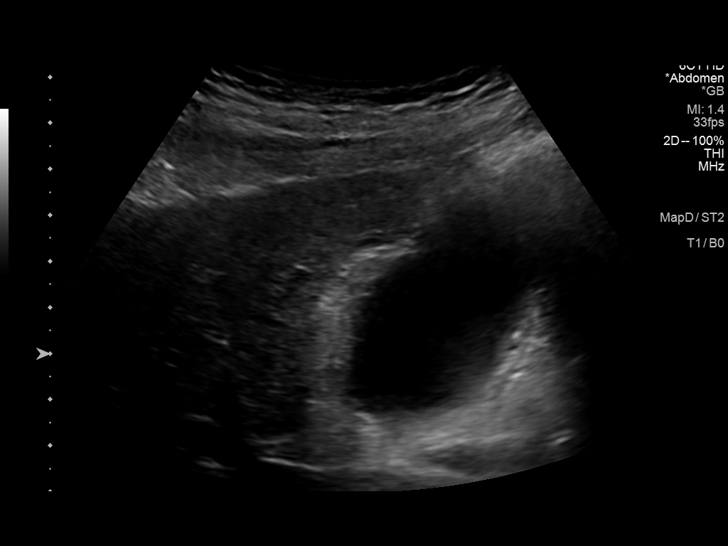
[im 18/48]
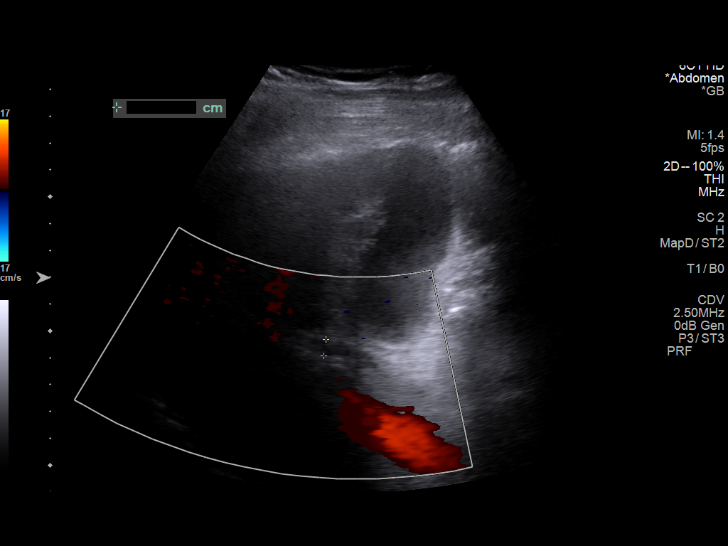
[im 22/48]
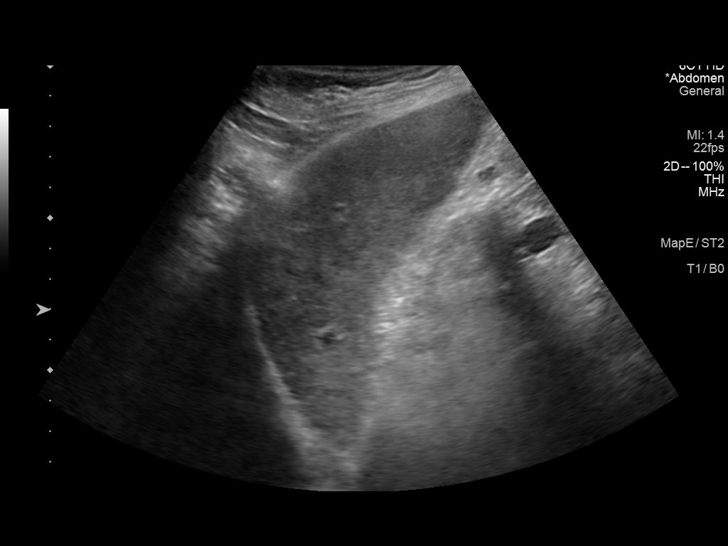
[im 26/48]
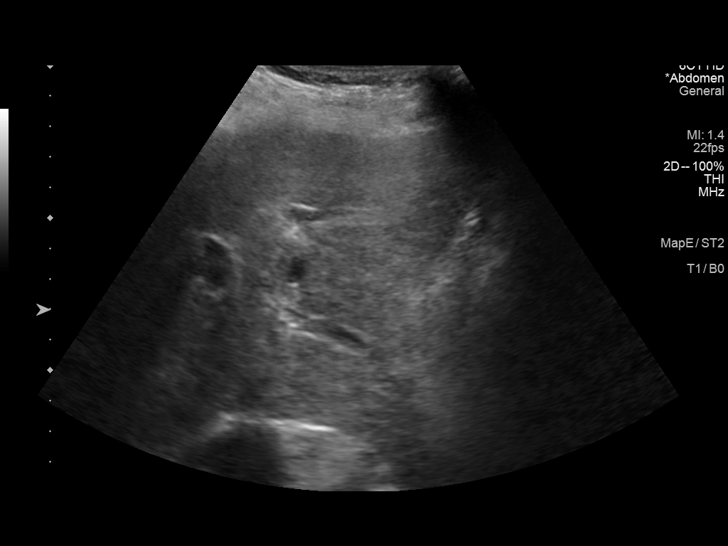
[im 30/48]
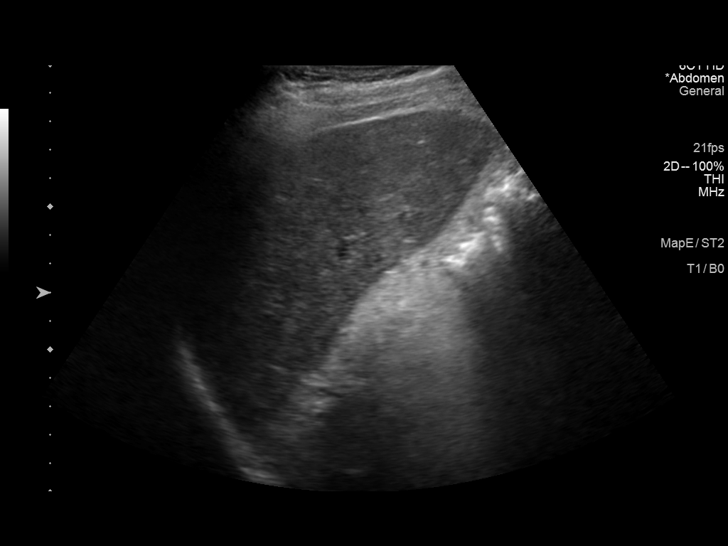
[im 32/48]
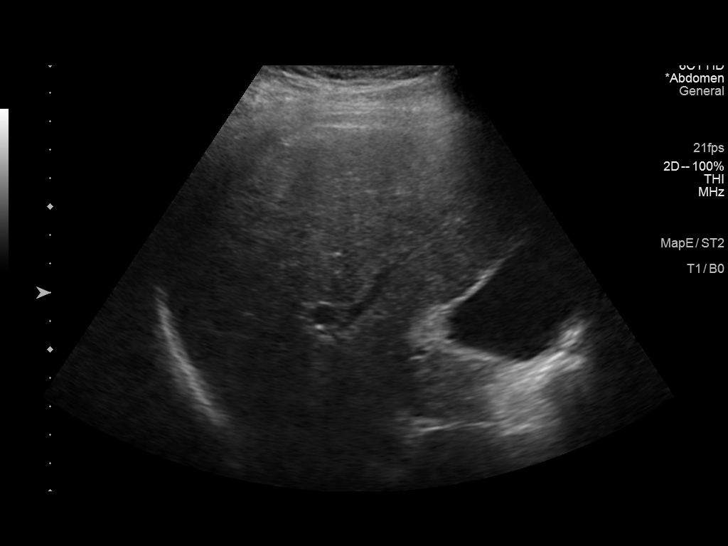
[im 36/48]
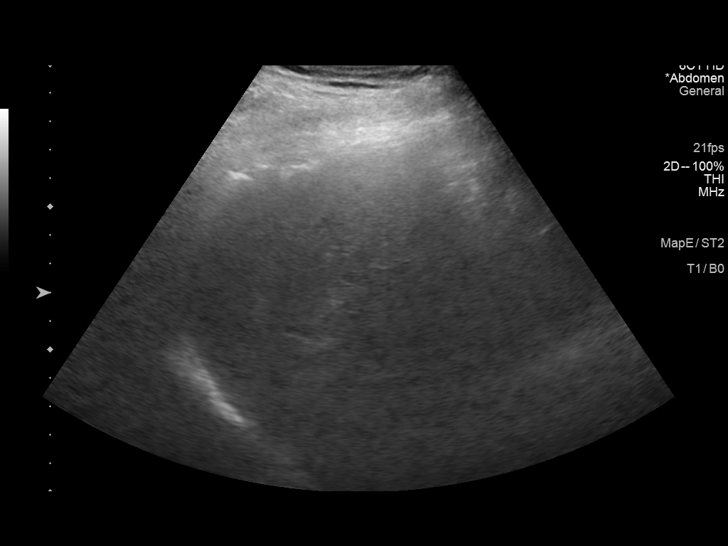
[im 40/48]
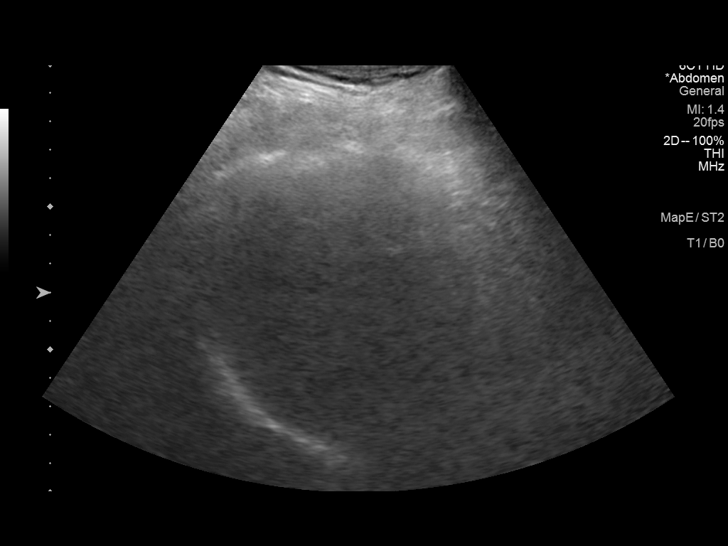
[im 44/48]
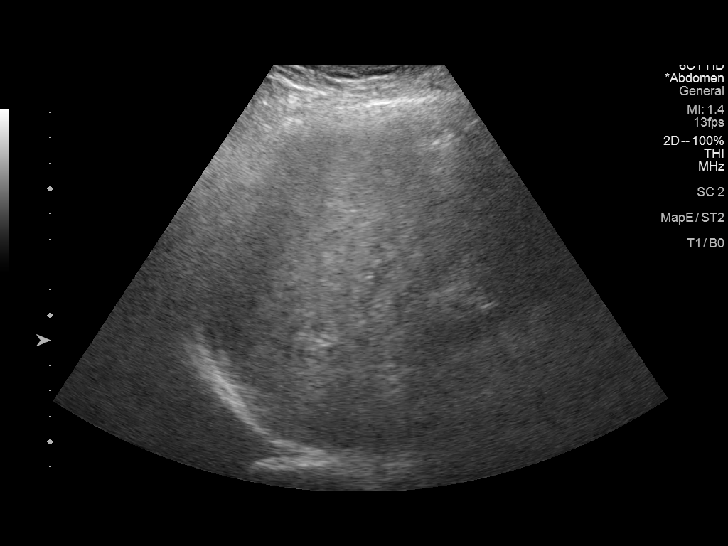
[im 48/48]
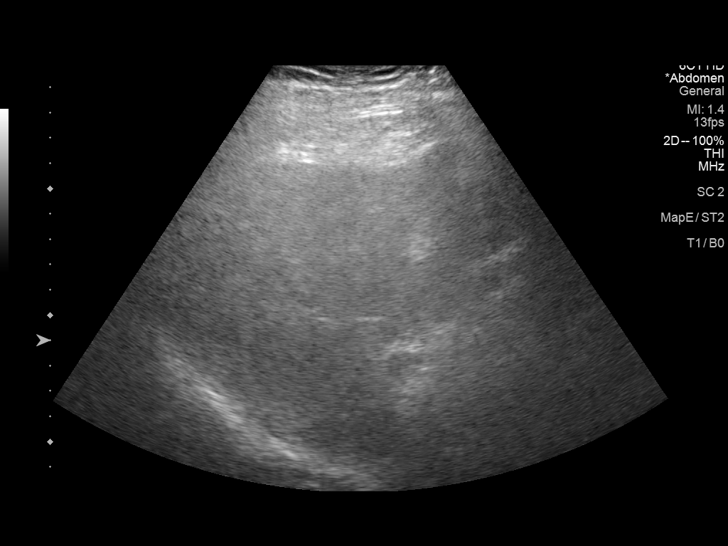

[14 of 25 positions shown; findings below may reference images not displayed]

FINDINGS: Gallbladder:

No gallstones or wall thickening visualized. No sonographic Murphy
sign noted by sonographer.

Common bile duct:

Diameter: Normal caliber, 6 mm

Liver:

Coarsened echotexture and nodular contours compatible with
cirrhosis. No focal hepatic abnormality or biliary ductal
dilatation. Portal vein is patent on color Doppler imaging with
normal direction of blood flow towards the liver.
IMPRESSION: Changes of cirrhosis.  No focal hepatic abnormality.

## 2021-04-16 ENCOUNTER — Telehealth: Payer: Self-pay

## 2021-04-16 NOTE — Telephone Encounter (Signed)
Attempted to contact patient's brother Lennette Bihari to schedule a Palliative Care consult appointment. No answer left a message to return call.  ? ?

## 2021-04-21 ENCOUNTER — Telehealth: Payer: Self-pay

## 2021-04-21 NOTE — Telephone Encounter (Signed)
Spoke with patient regarding Palliative Care services. He requested that his sister in law La Rose call back to discuss and schedule consult. Patient will have Victorino Dike call.  ?

## 2021-04-23 ENCOUNTER — Telehealth: Payer: Self-pay

## 2021-04-23 NOTE — Telephone Encounter (Signed)
Spoke with patient's brother Caryn Bee regarding Palliative Care services. He states patient is now on board with Hospice services. Patient had a Hospice AV on 2/21 and refused services at that time. I will forward to hospice team to reach out for AV.  ?

## 2021-05-16 DEATH — deceased
